# Patient Record
Sex: Female | Born: 1955 | Race: Black or African American | Hispanic: No | Smoking: Current every day smoker
Health system: Southern US, Community
[De-identification: ages and names within clinical notes are randomized; demographics above are authoritative.]

## PROBLEM LIST (undated history)

## (undated) DIAGNOSIS — E119 Type 2 diabetes mellitus without complications: Secondary | ICD-10-CM

## (undated) DIAGNOSIS — IMO0002 Reserved for concepts with insufficient information to code with codable children: Secondary | ICD-10-CM

## (undated) DIAGNOSIS — G43909 Migraine, unspecified, not intractable, without status migrainosus: Secondary | ICD-10-CM

## (undated) DIAGNOSIS — F329 Major depressive disorder, single episode, unspecified: Secondary | ICD-10-CM

## (undated) DIAGNOSIS — E079 Disorder of thyroid, unspecified: Secondary | ICD-10-CM

## (undated) DIAGNOSIS — G8929 Other chronic pain: Secondary | ICD-10-CM

## (undated) DIAGNOSIS — R011 Cardiac murmur, unspecified: Secondary | ICD-10-CM

## (undated) DIAGNOSIS — F419 Anxiety disorder, unspecified: Secondary | ICD-10-CM

## (undated) DIAGNOSIS — I10 Essential (primary) hypertension: Secondary | ICD-10-CM

## (undated) DIAGNOSIS — F32A Depression, unspecified: Secondary | ICD-10-CM

## (undated) DIAGNOSIS — E785 Hyperlipidemia, unspecified: Secondary | ICD-10-CM

## (undated) DIAGNOSIS — G4700 Insomnia, unspecified: Secondary | ICD-10-CM

## (undated) HISTORY — DX: Insomnia, unspecified: G47.00

## (undated) HISTORY — DX: Major depressive disorder, single episode, unspecified: F32.9

## (undated) HISTORY — DX: Disorder of thyroid, unspecified: E07.9

## (undated) HISTORY — DX: Hyperlipidemia, unspecified: E78.5

## (undated) HISTORY — PX: ABDOMINAL HYSTERECTOMY: SHX81

## (undated) HISTORY — PX: CARPAL TUNNEL RELEASE: SHX101

## (undated) HISTORY — DX: Reserved for concepts with insufficient information to code with codable children: IMO0002

## (undated) HISTORY — PX: WISDOM TOOTH EXTRACTION: SHX21

## (undated) HISTORY — PX: APPENDECTOMY: SHX54

## (undated) HISTORY — DX: Type 2 diabetes mellitus without complications: E11.9

## (undated) HISTORY — DX: Depression, unspecified: F32.A

## (undated) HISTORY — DX: Anxiety disorder, unspecified: F41.9

## (undated) HISTORY — DX: Cardiac murmur, unspecified: R01.1

## (undated) HISTORY — PX: UTERINE FIBROID SURGERY: SHX826

---

## 1988-04-07 HISTORY — PX: CYST EXCISION: SHX5701

## 1997-10-03 ENCOUNTER — Emergency Department (HOSPITAL_COMMUNITY): Admission: EM | Admit: 1997-10-03 | Discharge: 1997-10-03 | Payer: Self-pay | Admitting: Emergency Medicine

## 1997-10-26 ENCOUNTER — Emergency Department (HOSPITAL_COMMUNITY): Admission: EM | Admit: 1997-10-26 | Discharge: 1997-10-26 | Payer: Self-pay | Admitting: Emergency Medicine

## 1997-12-14 ENCOUNTER — Emergency Department (HOSPITAL_COMMUNITY): Admission: EM | Admit: 1997-12-14 | Discharge: 1997-12-14 | Payer: Self-pay

## 1998-01-03 ENCOUNTER — Encounter: Admission: RE | Admit: 1998-01-03 | Discharge: 1998-01-03 | Payer: Self-pay | Admitting: Internal Medicine

## 1998-04-03 ENCOUNTER — Inpatient Hospital Stay (HOSPITAL_COMMUNITY): Admission: AD | Admit: 1998-04-03 | Discharge: 1998-04-03 | Payer: Self-pay | Admitting: *Deleted

## 1998-04-10 ENCOUNTER — Encounter: Admission: RE | Admit: 1998-04-10 | Discharge: 1998-04-10 | Payer: Self-pay | Admitting: Internal Medicine

## 1998-04-17 ENCOUNTER — Emergency Department (HOSPITAL_COMMUNITY): Admission: EM | Admit: 1998-04-17 | Discharge: 1998-04-17 | Payer: Self-pay | Admitting: Emergency Medicine

## 1998-04-19 ENCOUNTER — Encounter: Payer: Self-pay | Admitting: Obstetrics

## 1998-04-19 ENCOUNTER — Ambulatory Visit (HOSPITAL_COMMUNITY): Admission: RE | Admit: 1998-04-19 | Discharge: 1998-04-19 | Payer: Self-pay | Admitting: Obstetrics

## 1998-05-03 ENCOUNTER — Emergency Department (HOSPITAL_COMMUNITY): Admission: EM | Admit: 1998-05-03 | Discharge: 1998-05-03 | Payer: Self-pay | Admitting: Emergency Medicine

## 1998-06-21 ENCOUNTER — Emergency Department (HOSPITAL_COMMUNITY): Admission: EM | Admit: 1998-06-21 | Discharge: 1998-06-21 | Payer: Self-pay | Admitting: Emergency Medicine

## 1998-06-23 ENCOUNTER — Emergency Department (HOSPITAL_COMMUNITY): Admission: EM | Admit: 1998-06-23 | Discharge: 1998-06-23 | Payer: Self-pay | Admitting: Emergency Medicine

## 1998-08-12 ENCOUNTER — Emergency Department (HOSPITAL_COMMUNITY): Admission: EM | Admit: 1998-08-12 | Discharge: 1998-08-12 | Payer: Self-pay | Admitting: Emergency Medicine

## 1998-10-17 ENCOUNTER — Emergency Department (HOSPITAL_COMMUNITY): Admission: EM | Admit: 1998-10-17 | Discharge: 1998-10-17 | Payer: Self-pay | Admitting: Emergency Medicine

## 1998-10-19 ENCOUNTER — Emergency Department (HOSPITAL_COMMUNITY): Admission: EM | Admit: 1998-10-19 | Discharge: 1998-10-19 | Payer: Self-pay | Admitting: Emergency Medicine

## 1999-01-08 ENCOUNTER — Emergency Department (HOSPITAL_COMMUNITY): Admission: EM | Admit: 1999-01-08 | Discharge: 1999-01-09 | Payer: Self-pay

## 1999-01-11 ENCOUNTER — Encounter: Admission: RE | Admit: 1999-01-11 | Discharge: 1999-01-11 | Payer: Self-pay | Admitting: Internal Medicine

## 1999-04-22 ENCOUNTER — Emergency Department (HOSPITAL_COMMUNITY): Admission: EM | Admit: 1999-04-22 | Discharge: 1999-04-22 | Payer: Self-pay | Admitting: *Deleted

## 1999-06-21 ENCOUNTER — Encounter: Admission: RE | Admit: 1999-06-21 | Discharge: 1999-06-21 | Payer: Self-pay | Admitting: Obstetrics & Gynecology

## 1999-07-05 ENCOUNTER — Emergency Department (HOSPITAL_COMMUNITY): Admission: EM | Admit: 1999-07-05 | Discharge: 1999-07-06 | Payer: Self-pay | Admitting: Emergency Medicine

## 1999-08-13 ENCOUNTER — Ambulatory Visit (HOSPITAL_COMMUNITY): Admission: RE | Admit: 1999-08-13 | Discharge: 1999-08-13 | Payer: Self-pay | Admitting: Orthopedic Surgery

## 1999-08-13 ENCOUNTER — Encounter: Payer: Self-pay | Admitting: Orthopedic Surgery

## 1999-12-29 ENCOUNTER — Emergency Department (HOSPITAL_COMMUNITY): Admission: EM | Admit: 1999-12-29 | Discharge: 1999-12-29 | Payer: Self-pay | Admitting: Emergency Medicine

## 2000-01-03 ENCOUNTER — Encounter: Admission: RE | Admit: 2000-01-03 | Discharge: 2000-01-03 | Payer: Self-pay | Admitting: Internal Medicine

## 2000-03-10 ENCOUNTER — Encounter: Admission: RE | Admit: 2000-03-10 | Discharge: 2000-03-10 | Payer: Self-pay | Admitting: Internal Medicine

## 2000-03-20 ENCOUNTER — Encounter: Admission: RE | Admit: 2000-03-20 | Discharge: 2000-03-20 | Payer: Self-pay | Admitting: Obstetrics & Gynecology

## 2000-04-25 ENCOUNTER — Inpatient Hospital Stay (HOSPITAL_COMMUNITY): Admission: AD | Admit: 2000-04-25 | Discharge: 2000-04-25 | Payer: Self-pay | Admitting: *Deleted

## 2000-04-26 ENCOUNTER — Inpatient Hospital Stay (HOSPITAL_COMMUNITY): Admission: AD | Admit: 2000-04-26 | Discharge: 2000-04-26 | Payer: Self-pay | Admitting: *Deleted

## 2000-05-28 ENCOUNTER — Inpatient Hospital Stay (HOSPITAL_COMMUNITY): Admission: AD | Admit: 2000-05-28 | Discharge: 2000-05-28 | Payer: Self-pay | Admitting: Obstetrics & Gynecology

## 2000-06-30 ENCOUNTER — Encounter: Admission: RE | Admit: 2000-06-30 | Discharge: 2000-06-30 | Payer: Self-pay | Admitting: Obstetrics & Gynecology

## 2000-07-28 ENCOUNTER — Encounter: Admission: RE | Admit: 2000-07-28 | Discharge: 2000-07-28 | Payer: Self-pay | Admitting: Internal Medicine

## 2000-08-04 ENCOUNTER — Encounter: Admission: RE | Admit: 2000-08-04 | Discharge: 2000-08-04 | Payer: Self-pay | Admitting: Psychology

## 2000-08-25 ENCOUNTER — Encounter: Admission: RE | Admit: 2000-08-25 | Discharge: 2000-08-25 | Payer: Self-pay | Admitting: Obstetrics & Gynecology

## 2000-09-21 ENCOUNTER — Encounter (INDEPENDENT_AMBULATORY_CARE_PROVIDER_SITE_OTHER): Payer: Self-pay | Admitting: Specialist

## 2000-09-21 ENCOUNTER — Ambulatory Visit (HOSPITAL_COMMUNITY): Admission: RE | Admit: 2000-09-21 | Discharge: 2000-09-21 | Payer: Self-pay | Admitting: Obstetrics & Gynecology

## 2000-10-06 ENCOUNTER — Encounter: Admission: RE | Admit: 2000-10-06 | Discharge: 2000-10-06 | Payer: Self-pay | Admitting: Obstetrics & Gynecology

## 2000-10-20 ENCOUNTER — Ambulatory Visit (HOSPITAL_COMMUNITY): Admission: RE | Admit: 2000-10-20 | Discharge: 2000-10-20 | Payer: Self-pay | Admitting: Obstetrics & Gynecology

## 2000-12-18 ENCOUNTER — Encounter: Admission: RE | Admit: 2000-12-18 | Discharge: 2000-12-18 | Payer: Self-pay | Admitting: Internal Medicine

## 2001-01-19 ENCOUNTER — Encounter: Admission: RE | Admit: 2001-01-19 | Discharge: 2001-01-19 | Payer: Self-pay | Admitting: Obstetrics & Gynecology

## 2001-02-08 ENCOUNTER — Encounter: Admission: RE | Admit: 2001-02-08 | Discharge: 2001-02-08 | Payer: Self-pay | Admitting: Internal Medicine

## 2001-02-16 ENCOUNTER — Encounter: Admission: RE | Admit: 2001-02-16 | Discharge: 2001-02-16 | Payer: Self-pay | Admitting: Internal Medicine

## 2001-03-22 ENCOUNTER — Encounter (INDEPENDENT_AMBULATORY_CARE_PROVIDER_SITE_OTHER): Payer: Self-pay | Admitting: Specialist

## 2001-03-22 ENCOUNTER — Inpatient Hospital Stay (HOSPITAL_COMMUNITY): Admission: RE | Admit: 2001-03-22 | Discharge: 2001-03-26 | Payer: Self-pay | Admitting: *Deleted

## 2001-03-29 ENCOUNTER — Encounter: Payer: Self-pay | Admitting: Obstetrics

## 2001-03-29 ENCOUNTER — Inpatient Hospital Stay (HOSPITAL_COMMUNITY): Admission: AD | Admit: 2001-03-29 | Discharge: 2001-04-03 | Payer: Self-pay | Admitting: Obstetrics

## 2001-03-30 ENCOUNTER — Encounter: Payer: Self-pay | Admitting: Obstetrics

## 2001-03-31 ENCOUNTER — Encounter: Payer: Self-pay | Admitting: Obstetrics

## 2001-04-01 ENCOUNTER — Encounter: Payer: Self-pay | Admitting: Obstetrics

## 2001-04-04 ENCOUNTER — Observation Stay (HOSPITAL_COMMUNITY): Admission: AD | Admit: 2001-04-04 | Discharge: 2001-04-05 | Payer: Self-pay | Admitting: Obstetrics

## 2001-04-07 DIAGNOSIS — F332 Major depressive disorder, recurrent severe without psychotic features: Secondary | ICD-10-CM | POA: Insufficient documentation

## 2001-05-30 ENCOUNTER — Emergency Department (HOSPITAL_COMMUNITY): Admission: EM | Admit: 2001-05-30 | Discharge: 2001-05-30 | Payer: Self-pay | Admitting: Emergency Medicine

## 2001-06-28 ENCOUNTER — Encounter: Admission: RE | Admit: 2001-06-28 | Discharge: 2001-06-28 | Payer: Self-pay | Admitting: Internal Medicine

## 2001-12-21 ENCOUNTER — Encounter: Admission: RE | Admit: 2001-12-21 | Discharge: 2001-12-21 | Payer: Self-pay | Admitting: Internal Medicine

## 2001-12-28 ENCOUNTER — Emergency Department (HOSPITAL_COMMUNITY): Admission: EM | Admit: 2001-12-28 | Discharge: 2001-12-28 | Payer: Self-pay | Admitting: Emergency Medicine

## 2002-06-26 ENCOUNTER — Emergency Department (HOSPITAL_COMMUNITY): Admission: EM | Admit: 2002-06-26 | Discharge: 2002-06-26 | Payer: Self-pay | Admitting: Emergency Medicine

## 2002-07-07 ENCOUNTER — Encounter: Admission: RE | Admit: 2002-07-07 | Discharge: 2002-08-30 | Payer: Self-pay | Admitting: Orthopedic Surgery

## 2002-08-15 ENCOUNTER — Ambulatory Visit (HOSPITAL_COMMUNITY): Admission: RE | Admit: 2002-08-15 | Discharge: 2002-08-15 | Payer: Self-pay | Admitting: Obstetrics & Gynecology

## 2002-08-15 ENCOUNTER — Encounter: Payer: Self-pay | Admitting: Obstetrics & Gynecology

## 2002-08-22 ENCOUNTER — Encounter: Admission: RE | Admit: 2002-08-22 | Discharge: 2002-08-22 | Payer: Self-pay | Admitting: Internal Medicine

## 2002-09-30 ENCOUNTER — Emergency Department (HOSPITAL_COMMUNITY): Admission: AD | Admit: 2002-09-30 | Discharge: 2002-09-30 | Payer: Self-pay | Admitting: Emergency Medicine

## 2002-10-09 ENCOUNTER — Emergency Department (HOSPITAL_COMMUNITY): Admission: AD | Admit: 2002-10-09 | Discharge: 2002-10-09 | Payer: Self-pay | Admitting: *Deleted

## 2002-10-12 ENCOUNTER — Encounter: Payer: Self-pay | Admitting: Orthopedic Surgery

## 2002-10-12 ENCOUNTER — Encounter: Admission: RE | Admit: 2002-10-12 | Discharge: 2002-10-12 | Payer: Self-pay | Admitting: Orthopedic Surgery

## 2002-10-12 ENCOUNTER — Encounter: Payer: Self-pay | Admitting: Diagnostic Radiology

## 2002-10-22 ENCOUNTER — Emergency Department (HOSPITAL_COMMUNITY): Admission: EM | Admit: 2002-10-22 | Discharge: 2002-10-23 | Payer: Self-pay | Admitting: Emergency Medicine

## 2002-11-26 ENCOUNTER — Emergency Department (HOSPITAL_COMMUNITY): Admission: EM | Admit: 2002-11-26 | Discharge: 2002-11-26 | Payer: Self-pay | Admitting: Emergency Medicine

## 2002-12-17 ENCOUNTER — Emergency Department (HOSPITAL_COMMUNITY): Admission: EM | Admit: 2002-12-17 | Discharge: 2002-12-18 | Payer: Self-pay | Admitting: Emergency Medicine

## 2003-01-09 ENCOUNTER — Emergency Department (HOSPITAL_COMMUNITY): Admission: EM | Admit: 2003-01-09 | Discharge: 2003-01-10 | Payer: Self-pay | Admitting: Emergency Medicine

## 2003-02-01 ENCOUNTER — Emergency Department (HOSPITAL_COMMUNITY): Admission: EM | Admit: 2003-02-01 | Discharge: 2003-02-01 | Payer: Self-pay | Admitting: Emergency Medicine

## 2003-02-03 ENCOUNTER — Emergency Department (HOSPITAL_COMMUNITY): Admission: EM | Admit: 2003-02-03 | Discharge: 2003-02-03 | Payer: Self-pay | Admitting: Emergency Medicine

## 2003-03-15 ENCOUNTER — Emergency Department (HOSPITAL_COMMUNITY): Admission: EM | Admit: 2003-03-15 | Discharge: 2003-03-15 | Payer: Self-pay | Admitting: Emergency Medicine

## 2003-05-02 ENCOUNTER — Emergency Department (HOSPITAL_COMMUNITY): Admission: EM | Admit: 2003-05-02 | Discharge: 2003-05-02 | Payer: Self-pay | Admitting: *Deleted

## 2003-11-03 ENCOUNTER — Emergency Department (HOSPITAL_COMMUNITY): Admission: EM | Admit: 2003-11-03 | Discharge: 2003-11-03 | Payer: Self-pay

## 2003-12-10 ENCOUNTER — Emergency Department (HOSPITAL_COMMUNITY): Admission: EM | Admit: 2003-12-10 | Discharge: 2003-12-11 | Payer: Self-pay | Admitting: Emergency Medicine

## 2004-04-16 ENCOUNTER — Encounter
Admission: RE | Admit: 2004-04-16 | Discharge: 2004-07-15 | Payer: Self-pay | Admitting: Physical Medicine and Rehabilitation

## 2004-07-13 ENCOUNTER — Emergency Department (HOSPITAL_COMMUNITY): Admission: EM | Admit: 2004-07-13 | Discharge: 2004-07-13 | Payer: Self-pay | Admitting: Emergency Medicine

## 2004-08-15 ENCOUNTER — Encounter
Admission: RE | Admit: 2004-08-15 | Discharge: 2004-11-13 | Payer: Self-pay | Admitting: Physical Medicine & Rehabilitation

## 2004-09-10 ENCOUNTER — Emergency Department (HOSPITAL_COMMUNITY): Admission: EM | Admit: 2004-09-10 | Discharge: 2004-09-10 | Payer: Self-pay | Admitting: Emergency Medicine

## 2004-09-30 ENCOUNTER — Ambulatory Visit: Payer: Self-pay | Admitting: Physical Medicine & Rehabilitation

## 2004-11-08 ENCOUNTER — Ambulatory Visit: Payer: Self-pay | Admitting: Physical Medicine & Rehabilitation

## 2004-12-19 ENCOUNTER — Encounter
Admission: RE | Admit: 2004-12-19 | Discharge: 2005-03-19 | Payer: Self-pay | Admitting: Physical Medicine & Rehabilitation

## 2005-02-01 ENCOUNTER — Emergency Department (HOSPITAL_COMMUNITY): Admission: EM | Admit: 2005-02-01 | Discharge: 2005-02-01 | Payer: Self-pay | Admitting: Emergency Medicine

## 2005-02-07 ENCOUNTER — Ambulatory Visit: Payer: Self-pay | Admitting: Physical Medicine & Rehabilitation

## 2005-03-07 ENCOUNTER — Emergency Department (HOSPITAL_COMMUNITY): Admission: EM | Admit: 2005-03-07 | Discharge: 2005-03-07 | Payer: Self-pay | Admitting: Emergency Medicine

## 2005-04-25 ENCOUNTER — Emergency Department (HOSPITAL_COMMUNITY): Admission: EM | Admit: 2005-04-25 | Discharge: 2005-04-25 | Payer: Self-pay | Admitting: Emergency Medicine

## 2005-06-10 ENCOUNTER — Encounter
Admission: RE | Admit: 2005-06-10 | Discharge: 2005-09-08 | Payer: Self-pay | Admitting: Physical Medicine & Rehabilitation

## 2005-06-10 ENCOUNTER — Ambulatory Visit: Payer: Self-pay | Admitting: Physical Medicine & Rehabilitation

## 2005-09-15 ENCOUNTER — Emergency Department (HOSPITAL_COMMUNITY): Admission: EM | Admit: 2005-09-15 | Discharge: 2005-09-15 | Payer: Self-pay | Admitting: Emergency Medicine

## 2005-10-21 ENCOUNTER — Encounter
Admission: RE | Admit: 2005-10-21 | Discharge: 2006-01-19 | Payer: Self-pay | Admitting: Physical Medicine & Rehabilitation

## 2005-10-24 ENCOUNTER — Ambulatory Visit: Payer: Self-pay | Admitting: Physical Medicine & Rehabilitation

## 2005-11-28 ENCOUNTER — Ambulatory Visit: Payer: Self-pay | Admitting: Physical Medicine & Rehabilitation

## 2006-01-13 ENCOUNTER — Ambulatory Visit: Payer: Self-pay | Admitting: Family Medicine

## 2006-02-05 ENCOUNTER — Ambulatory Visit: Payer: Self-pay | Admitting: Internal Medicine

## 2006-03-06 ENCOUNTER — Ambulatory Visit: Payer: Self-pay | Admitting: Internal Medicine

## 2006-04-03 ENCOUNTER — Ambulatory Visit: Payer: Self-pay | Admitting: Internal Medicine

## 2006-04-03 LAB — CONVERTED CEMR LAB
Cholesterol: 166 mg/dL
HDL: 35 mg/dL
LDL Cholesterol: 89 mg/dL
Triglycerides: 208 mg/dL

## 2006-04-09 ENCOUNTER — Ambulatory Visit: Payer: Self-pay | Admitting: *Deleted

## 2006-04-24 ENCOUNTER — Emergency Department (HOSPITAL_COMMUNITY): Admission: EM | Admit: 2006-04-24 | Discharge: 2006-04-24 | Payer: Self-pay | Admitting: Emergency Medicine

## 2006-04-25 ENCOUNTER — Emergency Department (HOSPITAL_COMMUNITY): Admission: EM | Admit: 2006-04-25 | Discharge: 2006-04-25 | Payer: Self-pay | Admitting: Emergency Medicine

## 2006-08-06 ENCOUNTER — Ambulatory Visit: Payer: Self-pay | Admitting: Internal Medicine

## 2006-08-20 ENCOUNTER — Ambulatory Visit (HOSPITAL_COMMUNITY): Admission: RE | Admit: 2006-08-20 | Discharge: 2006-08-20 | Payer: Self-pay | Admitting: Internal Medicine

## 2006-08-20 ENCOUNTER — Encounter (INDEPENDENT_AMBULATORY_CARE_PROVIDER_SITE_OTHER): Payer: Self-pay | Admitting: Internal Medicine

## 2006-08-20 LAB — CONVERTED CEMR LAB: Pap Smear: NORMAL

## 2006-11-16 ENCOUNTER — Encounter: Payer: Self-pay | Admitting: Internal Medicine

## 2006-11-16 DIAGNOSIS — I10 Essential (primary) hypertension: Secondary | ICD-10-CM | POA: Insufficient documentation

## 2006-11-16 DIAGNOSIS — G43909 Migraine, unspecified, not intractable, without status migrainosus: Secondary | ICD-10-CM | POA: Insufficient documentation

## 2006-11-16 DIAGNOSIS — G5600 Carpal tunnel syndrome, unspecified upper limb: Secondary | ICD-10-CM

## 2006-11-16 DIAGNOSIS — E785 Hyperlipidemia, unspecified: Secondary | ICD-10-CM

## 2006-11-16 DIAGNOSIS — M5137 Other intervertebral disc degeneration, lumbosacral region: Secondary | ICD-10-CM

## 2006-12-04 ENCOUNTER — Telehealth (INDEPENDENT_AMBULATORY_CARE_PROVIDER_SITE_OTHER): Payer: Self-pay | Admitting: Internal Medicine

## 2006-12-22 ENCOUNTER — Telehealth (INDEPENDENT_AMBULATORY_CARE_PROVIDER_SITE_OTHER): Payer: Self-pay | Admitting: *Deleted

## 2006-12-23 ENCOUNTER — Encounter (INDEPENDENT_AMBULATORY_CARE_PROVIDER_SITE_OTHER): Payer: Self-pay | Admitting: *Deleted

## 2006-12-28 ENCOUNTER — Encounter (INDEPENDENT_AMBULATORY_CARE_PROVIDER_SITE_OTHER): Payer: Self-pay | Admitting: Internal Medicine

## 2007-01-14 ENCOUNTER — Ambulatory Visit: Payer: Self-pay | Admitting: Internal Medicine

## 2007-01-15 ENCOUNTER — Encounter (INDEPENDENT_AMBULATORY_CARE_PROVIDER_SITE_OTHER): Payer: Self-pay | Admitting: Internal Medicine

## 2007-02-14 ENCOUNTER — Telehealth (INDEPENDENT_AMBULATORY_CARE_PROVIDER_SITE_OTHER): Payer: Self-pay | Admitting: Internal Medicine

## 2007-04-09 ENCOUNTER — Telehealth (INDEPENDENT_AMBULATORY_CARE_PROVIDER_SITE_OTHER): Payer: Self-pay | Admitting: *Deleted

## 2007-06-02 ENCOUNTER — Emergency Department (HOSPITAL_COMMUNITY): Admission: EM | Admit: 2007-06-02 | Discharge: 2007-06-03 | Payer: Self-pay | Admitting: Emergency Medicine

## 2007-07-01 ENCOUNTER — Emergency Department (HOSPITAL_COMMUNITY): Admission: EM | Admit: 2007-07-01 | Discharge: 2007-07-01 | Payer: Self-pay | Admitting: Emergency Medicine

## 2007-10-06 ENCOUNTER — Telehealth (INDEPENDENT_AMBULATORY_CARE_PROVIDER_SITE_OTHER): Payer: Self-pay | Admitting: Internal Medicine

## 2007-11-18 ENCOUNTER — Encounter (INDEPENDENT_AMBULATORY_CARE_PROVIDER_SITE_OTHER): Payer: Self-pay | Admitting: *Deleted

## 2007-12-09 ENCOUNTER — Ambulatory Visit: Payer: Self-pay | Admitting: Internal Medicine

## 2007-12-15 LAB — CONVERTED CEMR LAB
ALT: 10 units/L (ref 0–35)
AST: 18 units/L (ref 0–37)
Albumin: 4.7 g/dL (ref 3.5–5.2)
Alkaline Phosphatase: 49 units/L (ref 39–117)
BUN: 12 mg/dL (ref 6–23)
Basophils Absolute: 0 10*3/uL (ref 0.0–0.1)
Basophils Relative: 0 % (ref 0–1)
CO2: 24 meq/L (ref 19–32)
Calcium: 9.4 mg/dL (ref 8.4–10.5)
Chloride: 107 meq/L (ref 96–112)
Creatinine, Ser: 1.01 mg/dL (ref 0.40–1.20)
Eosinophils Absolute: 0.1 10*3/uL (ref 0.0–0.7)
Eosinophils Relative: 2 % (ref 0–5)
Glucose, Bld: 78 mg/dL (ref 70–99)
HCT: 35.5 % — ABNORMAL LOW (ref 36.0–46.0)
Hemoglobin: 12 g/dL (ref 12.0–15.0)
Lymphocytes Relative: 45 % (ref 12–46)
Lymphs Abs: 2.6 10*3/uL (ref 0.7–4.0)
MCHC: 33.8 g/dL (ref 30.0–36.0)
MCV: 95.9 fL (ref 78.0–100.0)
Monocytes Absolute: 0.3 10*3/uL (ref 0.1–1.0)
Monocytes Relative: 5 % (ref 3–12)
Neutro Abs: 2.7 10*3/uL (ref 1.7–7.7)
Neutrophils Relative %: 47 % (ref 43–77)
Platelets: 220 10*3/uL (ref 150–400)
Potassium: 5.1 meq/L (ref 3.5–5.3)
RBC: 3.7 M/uL — ABNORMAL LOW (ref 3.87–5.11)
RDW: 12.9 % (ref 11.5–15.5)
Sodium: 140 meq/L (ref 135–145)
Total Bilirubin: 0.3 mg/dL (ref 0.3–1.2)
Total Protein: 7.9 g/dL (ref 6.0–8.3)
WBC: 5.8 10*3/uL (ref 4.0–10.5)

## 2007-12-17 ENCOUNTER — Encounter (INDEPENDENT_AMBULATORY_CARE_PROVIDER_SITE_OTHER): Payer: Self-pay | Admitting: Internal Medicine

## 2007-12-20 ENCOUNTER — Encounter: Admission: RE | Admit: 2007-12-20 | Discharge: 2008-02-02 | Payer: Self-pay | Admitting: Internal Medicine

## 2007-12-24 ENCOUNTER — Encounter (INDEPENDENT_AMBULATORY_CARE_PROVIDER_SITE_OTHER): Payer: Self-pay | Admitting: Internal Medicine

## 2008-01-07 ENCOUNTER — Telehealth (INDEPENDENT_AMBULATORY_CARE_PROVIDER_SITE_OTHER): Payer: Self-pay | Admitting: Internal Medicine

## 2008-02-03 ENCOUNTER — Encounter (INDEPENDENT_AMBULATORY_CARE_PROVIDER_SITE_OTHER): Payer: Self-pay | Admitting: Internal Medicine

## 2008-02-04 ENCOUNTER — Emergency Department (HOSPITAL_COMMUNITY): Admission: EM | Admit: 2008-02-04 | Discharge: 2008-02-05 | Payer: Self-pay | Admitting: *Deleted

## 2008-02-17 ENCOUNTER — Telehealth (INDEPENDENT_AMBULATORY_CARE_PROVIDER_SITE_OTHER): Payer: Self-pay | Admitting: *Deleted

## 2008-02-25 ENCOUNTER — Telehealth (INDEPENDENT_AMBULATORY_CARE_PROVIDER_SITE_OTHER): Payer: Self-pay | Admitting: Internal Medicine

## 2008-02-25 DIAGNOSIS — K029 Dental caries, unspecified: Secondary | ICD-10-CM | POA: Insufficient documentation

## 2008-02-28 ENCOUNTER — Encounter (INDEPENDENT_AMBULATORY_CARE_PROVIDER_SITE_OTHER): Payer: Self-pay | Admitting: Internal Medicine

## 2008-03-10 ENCOUNTER — Telehealth (INDEPENDENT_AMBULATORY_CARE_PROVIDER_SITE_OTHER): Payer: Self-pay | Admitting: Internal Medicine

## 2008-03-22 ENCOUNTER — Ambulatory Visit (HOSPITAL_COMMUNITY): Admission: RE | Admit: 2008-03-22 | Discharge: 2008-03-22 | Payer: Self-pay | Admitting: Internal Medicine

## 2008-04-07 ENCOUNTER — Emergency Department (HOSPITAL_COMMUNITY): Admission: EM | Admit: 2008-04-07 | Discharge: 2008-04-07 | Payer: Self-pay | Admitting: Emergency Medicine

## 2008-04-20 LAB — CONVERTED CEMR LAB: Pap Smear: NORMAL

## 2008-05-08 ENCOUNTER — Emergency Department (HOSPITAL_COMMUNITY): Admission: EM | Admit: 2008-05-08 | Discharge: 2008-05-08 | Payer: Self-pay | Admitting: Emergency Medicine

## 2008-05-12 ENCOUNTER — Telehealth (INDEPENDENT_AMBULATORY_CARE_PROVIDER_SITE_OTHER): Payer: Self-pay | Admitting: Internal Medicine

## 2008-06-08 ENCOUNTER — Emergency Department (HOSPITAL_COMMUNITY): Admission: EM | Admit: 2008-06-08 | Discharge: 2008-06-08 | Payer: Self-pay | Admitting: Emergency Medicine

## 2008-06-19 ENCOUNTER — Encounter (INDEPENDENT_AMBULATORY_CARE_PROVIDER_SITE_OTHER): Payer: Self-pay | Admitting: Internal Medicine

## 2008-07-06 ENCOUNTER — Telehealth (INDEPENDENT_AMBULATORY_CARE_PROVIDER_SITE_OTHER): Payer: Self-pay | Admitting: Internal Medicine

## 2008-07-14 ENCOUNTER — Ambulatory Visit: Payer: Self-pay | Admitting: Internal Medicine

## 2008-07-31 LAB — CONVERTED CEMR LAB
ALT: 46 units/L — ABNORMAL HIGH (ref 0–35)
AST: 26 units/L (ref 0–37)
Albumin: 4.8 g/dL (ref 3.5–5.2)
Alkaline Phosphatase: 78 units/L (ref 39–117)
BUN: 11 mg/dL (ref 6–23)
Basophils Absolute: 0 10*3/uL (ref 0.0–0.1)
Basophils Relative: 0 % (ref 0–1)
CO2: 26 meq/L (ref 19–32)
Calcium: 10.3 mg/dL (ref 8.4–10.5)
Chloride: 104 meq/L (ref 96–112)
Cholesterol: 157 mg/dL (ref 0–200)
Creatinine, Ser: 0.88 mg/dL (ref 0.40–1.20)
Eosinophils Absolute: 0.1 10*3/uL (ref 0.0–0.7)
Eosinophils Relative: 1 % (ref 0–5)
Glucose, Bld: 86 mg/dL (ref 70–99)
HCT: 38 % (ref 36.0–46.0)
HDL: 37 mg/dL — ABNORMAL LOW (ref >39)
Hemoglobin: 12.9 g/dL (ref 12.0–15.0)
LDL Cholesterol: 81 mg/dL (ref 0–99)
Lymphocytes Relative: 40 % (ref 12–46)
Lymphs Abs: 2 10*3/uL (ref 0.7–4.0)
MCHC: 33.9 g/dL (ref 30.0–36.0)
MCV: 93.8 fL (ref 78.0–100.0)
Monocytes Absolute: 0.4 10*3/uL (ref 0.1–1.0)
Monocytes Relative: 7 % (ref 3–12)
Neutro Abs: 2.7 10*3/uL (ref 1.7–7.7)
Neutrophils Relative %: 52 % (ref 43–77)
Platelets: 234 10*3/uL (ref 150–400)
Potassium: 5.1 meq/L (ref 3.5–5.3)
RBC: 4.05 M/uL (ref 3.87–5.11)
RDW: 12.8 % (ref 11.5–15.5)
Sodium: 140 meq/L (ref 135–145)
Total Bilirubin: 0.3 mg/dL (ref 0.3–1.2)
Total CHOL/HDL Ratio: 4.2
Total Protein: 8.1 g/dL (ref 6.0–8.3)
Triglycerides: 196 mg/dL — ABNORMAL HIGH (ref <150)
VLDL: 39 mg/dL (ref 0–40)
WBC: 5.2 10*3/uL (ref 4.0–10.5)

## 2008-08-09 ENCOUNTER — Encounter (INDEPENDENT_AMBULATORY_CARE_PROVIDER_SITE_OTHER): Payer: Self-pay | Admitting: Internal Medicine

## 2008-09-30 ENCOUNTER — Emergency Department (HOSPITAL_COMMUNITY): Admission: EM | Admit: 2008-09-30 | Discharge: 2008-09-30 | Payer: Self-pay | Admitting: Emergency Medicine

## 2008-10-06 ENCOUNTER — Telehealth (INDEPENDENT_AMBULATORY_CARE_PROVIDER_SITE_OTHER): Payer: Self-pay | Admitting: Internal Medicine

## 2008-11-28 ENCOUNTER — Emergency Department (HOSPITAL_COMMUNITY): Admission: EM | Admit: 2008-11-28 | Discharge: 2008-11-28 | Payer: Self-pay | Admitting: Emergency Medicine

## 2009-01-05 ENCOUNTER — Telehealth (INDEPENDENT_AMBULATORY_CARE_PROVIDER_SITE_OTHER): Payer: Self-pay | Admitting: Internal Medicine

## 2009-02-02 ENCOUNTER — Encounter: Payer: Self-pay | Admitting: Internal Medicine

## 2009-02-24 ENCOUNTER — Emergency Department (HOSPITAL_COMMUNITY): Admission: EM | Admit: 2009-02-24 | Discharge: 2009-02-24 | Payer: Self-pay | Admitting: Emergency Medicine

## 2009-03-06 ENCOUNTER — Telehealth (INDEPENDENT_AMBULATORY_CARE_PROVIDER_SITE_OTHER): Payer: Self-pay | Admitting: *Deleted

## 2009-03-08 ENCOUNTER — Telehealth (INDEPENDENT_AMBULATORY_CARE_PROVIDER_SITE_OTHER): Payer: Self-pay | Admitting: Internal Medicine

## 2009-05-08 ENCOUNTER — Encounter (INDEPENDENT_AMBULATORY_CARE_PROVIDER_SITE_OTHER): Payer: Self-pay | Admitting: Internal Medicine

## 2009-06-05 ENCOUNTER — Telehealth (INDEPENDENT_AMBULATORY_CARE_PROVIDER_SITE_OTHER): Payer: Self-pay | Admitting: *Deleted

## 2009-06-30 ENCOUNTER — Emergency Department (HOSPITAL_COMMUNITY): Admission: EM | Admit: 2009-06-30 | Discharge: 2009-07-01 | Payer: Self-pay | Admitting: Emergency Medicine

## 2009-07-04 ENCOUNTER — Ambulatory Visit (HOSPITAL_COMMUNITY): Admission: RE | Admit: 2009-07-04 | Discharge: 2009-07-04 | Payer: Self-pay | Admitting: Internal Medicine

## 2009-07-17 ENCOUNTER — Ambulatory Visit: Payer: Self-pay | Admitting: Internal Medicine

## 2009-07-17 DIAGNOSIS — R300 Dysuria: Secondary | ICD-10-CM | POA: Insufficient documentation

## 2009-07-17 DIAGNOSIS — N760 Acute vaginitis: Secondary | ICD-10-CM | POA: Insufficient documentation

## 2009-07-17 LAB — CONVERTED CEMR LAB
Bilirubin Urine: NEGATIVE
Glucose, Urine, Semiquant: NEGATIVE
Ketones, urine, test strip: NEGATIVE
Nitrite: NEGATIVE
Protein, U semiquant: 30
Specific Gravity, Urine: >=1.03
Urobilinogen, UA: 0.2
pH: 5

## 2009-07-20 ENCOUNTER — Telehealth (INDEPENDENT_AMBULATORY_CARE_PROVIDER_SITE_OTHER): Payer: Self-pay | Admitting: Internal Medicine

## 2009-07-31 ENCOUNTER — Telehealth (INDEPENDENT_AMBULATORY_CARE_PROVIDER_SITE_OTHER): Payer: Self-pay | Admitting: Internal Medicine

## 2009-08-03 ENCOUNTER — Telehealth (INDEPENDENT_AMBULATORY_CARE_PROVIDER_SITE_OTHER): Payer: Self-pay | Admitting: Internal Medicine

## 2009-08-07 ENCOUNTER — Telehealth (INDEPENDENT_AMBULATORY_CARE_PROVIDER_SITE_OTHER): Payer: Self-pay | Admitting: Internal Medicine

## 2009-08-14 ENCOUNTER — Ambulatory Visit: Payer: Self-pay | Admitting: Internal Medicine

## 2009-08-14 LAB — CONVERTED CEMR LAB: Rapid HIV Screen: NEGATIVE

## 2009-08-31 ENCOUNTER — Telehealth (INDEPENDENT_AMBULATORY_CARE_PROVIDER_SITE_OTHER): Payer: Self-pay | Admitting: Internal Medicine

## 2009-09-02 ENCOUNTER — Encounter (INDEPENDENT_AMBULATORY_CARE_PROVIDER_SITE_OTHER): Payer: Self-pay | Admitting: Internal Medicine

## 2009-09-02 LAB — CONVERTED CEMR LAB
ALT: 28 units/L (ref 0–35)
AST: 36 units/L (ref 0–37)
Albumin: 4.6 g/dL (ref 3.5–5.2)
Alkaline Phosphatase: 64 units/L (ref 39–117)
BUN: 10 mg/dL (ref 6–23)
Basophils Absolute: 0 10*3/uL (ref 0.0–0.1)
Basophils Relative: 0 % (ref 0–1)
CO2: 24 meq/L (ref 19–32)
Calcium: 9.7 mg/dL (ref 8.4–10.5)
Chloride: 102 meq/L (ref 96–112)
Cholesterol: 201 mg/dL — ABNORMAL HIGH (ref 0–200)
Creatinine, Ser: 0.92 mg/dL (ref 0.40–1.20)
Eosinophils Absolute: 0.1 10*3/uL (ref 0.0–0.7)
Eosinophils Relative: 2 % (ref 0–5)
Glucose, Bld: 79 mg/dL (ref 70–99)
HCT: 36.4 % (ref 36.0–46.0)
HDL: 39 mg/dL — ABNORMAL LOW (ref >39)
Hemoglobin: 12.2 g/dL (ref 12.0–15.0)
LDL Cholesterol: 122 mg/dL — ABNORMAL HIGH (ref 0–99)
Lymphocytes Relative: 40 % (ref 12–46)
Lymphs Abs: 2 10*3/uL (ref 0.7–4.0)
MCHC: 33.5 g/dL (ref 30.0–36.0)
MCV: 94.1 fL (ref 78.0–100.0)
Monocytes Absolute: 0.3 10*3/uL (ref 0.1–1.0)
Monocytes Relative: 7 % (ref 3–12)
Neutro Abs: 2.6 10*3/uL (ref 1.7–7.7)
Neutrophils Relative %: 52 % (ref 43–77)
Platelets: 224 10*3/uL (ref 150–400)
Potassium: 4.1 meq/L (ref 3.5–5.3)
RBC: 3.87 M/uL (ref 3.87–5.11)
RDW: 13.8 % (ref 11.5–15.5)
Sodium: 140 meq/L (ref 135–145)
Total Bilirubin: 0.3 mg/dL (ref 0.3–1.2)
Total CHOL/HDL Ratio: 5.2
Total Protein: 8.3 g/dL (ref 6.0–8.3)
Triglycerides: 199 mg/dL — ABNORMAL HIGH (ref <150)
VLDL: 40 mg/dL (ref 0–40)
WBC: 5.1 10*3/uL (ref 4.0–10.5)

## 2009-09-04 ENCOUNTER — Ambulatory Visit: Payer: Self-pay | Admitting: Internal Medicine

## 2009-09-05 ENCOUNTER — Encounter (INDEPENDENT_AMBULATORY_CARE_PROVIDER_SITE_OTHER): Payer: Self-pay | Admitting: Internal Medicine

## 2009-09-05 ENCOUNTER — Telehealth (INDEPENDENT_AMBULATORY_CARE_PROVIDER_SITE_OTHER): Payer: Self-pay | Admitting: Internal Medicine

## 2009-09-11 ENCOUNTER — Encounter (INDEPENDENT_AMBULATORY_CARE_PROVIDER_SITE_OTHER): Payer: Self-pay | Admitting: Internal Medicine

## 2009-09-11 ENCOUNTER — Encounter: Admission: RE | Admit: 2009-09-11 | Discharge: 2009-09-11 | Payer: Self-pay | Admitting: Internal Medicine

## 2009-10-02 ENCOUNTER — Telehealth (INDEPENDENT_AMBULATORY_CARE_PROVIDER_SITE_OTHER): Payer: Self-pay | Admitting: Internal Medicine

## 2009-11-01 ENCOUNTER — Telehealth (INDEPENDENT_AMBULATORY_CARE_PROVIDER_SITE_OTHER): Payer: Self-pay | Admitting: Internal Medicine

## 2009-11-01 ENCOUNTER — Encounter (INDEPENDENT_AMBULATORY_CARE_PROVIDER_SITE_OTHER): Payer: Self-pay | Admitting: Internal Medicine

## 2009-11-22 ENCOUNTER — Telehealth (INDEPENDENT_AMBULATORY_CARE_PROVIDER_SITE_OTHER): Payer: Self-pay | Admitting: Nurse Practitioner

## 2009-11-23 ENCOUNTER — Telehealth (INDEPENDENT_AMBULATORY_CARE_PROVIDER_SITE_OTHER): Payer: Self-pay | Admitting: Internal Medicine

## 2009-11-23 ENCOUNTER — Encounter (INDEPENDENT_AMBULATORY_CARE_PROVIDER_SITE_OTHER): Payer: Self-pay | Admitting: Internal Medicine

## 2009-12-03 ENCOUNTER — Telehealth (INDEPENDENT_AMBULATORY_CARE_PROVIDER_SITE_OTHER): Payer: Self-pay | Admitting: Internal Medicine

## 2009-12-06 ENCOUNTER — Ambulatory Visit: Payer: Self-pay | Admitting: Internal Medicine

## 2009-12-06 DIAGNOSIS — G4700 Insomnia, unspecified: Secondary | ICD-10-CM | POA: Insufficient documentation

## 2009-12-27 ENCOUNTER — Ambulatory Visit: Payer: Self-pay | Admitting: Internal Medicine

## 2009-12-27 LAB — CONVERTED CEMR LAB
Cholesterol: 176 mg/dL (ref 0–200)
HDL: 32 mg/dL — ABNORMAL LOW (ref >39)
LDL Cholesterol: 96 mg/dL (ref 0–99)
Total CHOL/HDL Ratio: 5.5
Triglycerides: 242 mg/dL — ABNORMAL HIGH (ref <150)
VLDL: 48 mg/dL — ABNORMAL HIGH (ref 0–40)

## 2010-01-03 ENCOUNTER — Telehealth (INDEPENDENT_AMBULATORY_CARE_PROVIDER_SITE_OTHER): Payer: Self-pay | Admitting: Internal Medicine

## 2010-02-01 ENCOUNTER — Telehealth (INDEPENDENT_AMBULATORY_CARE_PROVIDER_SITE_OTHER): Payer: Self-pay | Admitting: Internal Medicine

## 2010-02-26 ENCOUNTER — Telehealth (INDEPENDENT_AMBULATORY_CARE_PROVIDER_SITE_OTHER): Payer: Self-pay | Admitting: *Deleted

## 2010-02-28 ENCOUNTER — Emergency Department (HOSPITAL_COMMUNITY): Admission: EM | Admit: 2010-02-28 | Discharge: 2010-02-28 | Payer: Self-pay | Admitting: Emergency Medicine

## 2010-04-02 ENCOUNTER — Telehealth (INDEPENDENT_AMBULATORY_CARE_PROVIDER_SITE_OTHER): Payer: Self-pay | Admitting: Internal Medicine

## 2010-04-12 ENCOUNTER — Ambulatory Visit
Admission: RE | Admit: 2010-04-12 | Discharge: 2010-04-12 | Payer: Self-pay | Source: Home / Self Care | Attending: Internal Medicine | Admitting: Internal Medicine

## 2010-04-13 ENCOUNTER — Encounter (INDEPENDENT_AMBULATORY_CARE_PROVIDER_SITE_OTHER): Payer: Self-pay | Admitting: Internal Medicine

## 2010-04-26 ENCOUNTER — Other Ambulatory Visit (HOSPITAL_COMMUNITY): Payer: Self-pay | Admitting: Internal Medicine

## 2010-04-26 ENCOUNTER — Ambulatory Visit
Admission: RE | Admit: 2010-04-26 | Discharge: 2010-04-26 | Payer: Self-pay | Source: Home / Self Care | Attending: Internal Medicine | Admitting: Internal Medicine

## 2010-04-26 DIAGNOSIS — Z1231 Encounter for screening mammogram for malignant neoplasm of breast: Secondary | ICD-10-CM

## 2010-04-26 DIAGNOSIS — Z139 Encounter for screening, unspecified: Secondary | ICD-10-CM

## 2010-04-26 LAB — CONVERTED CEMR LAB: Blood Glucose, Fingerstick: 95

## 2010-04-28 ENCOUNTER — Encounter: Payer: Self-pay | Admitting: Obstetrics

## 2010-04-29 LAB — CONVERTED CEMR LAB
Amphetamine Screen, Ur: NEGATIVE
Barbiturate Quant, Ur: NEGATIVE
Benzodiazepines.: NEGATIVE
Cocaine Metabolites: NEGATIVE
Creatinine,U: 223.3 mg/dL
Marijuana Metabolite: NEGATIVE
Methadone: NEGATIVE
Opiate Screen, Urine: POSITIVE — AB
Phencyclidine (PCP): NEGATIVE
Propoxyphene: NEGATIVE

## 2010-05-02 ENCOUNTER — Telehealth (INDEPENDENT_AMBULATORY_CARE_PROVIDER_SITE_OTHER): Payer: Self-pay | Admitting: Internal Medicine

## 2010-05-03 ENCOUNTER — Ambulatory Visit
Admission: RE | Admit: 2010-05-03 | Discharge: 2010-05-03 | Payer: Self-pay | Source: Home / Self Care | Attending: Internal Medicine | Admitting: Internal Medicine

## 2010-05-07 NOTE — Letter (Signed)
Summary: Generic Letter  [facility]  [address] [place], [place] [address]   Phone: [phone]  Fax: [phone]    05/08/2009  Christie Cervantes [address] [place], [place]  [address]  Dear [name],  I was disappointed not to see you n 05/08/09 at your appointed time.  To continue receiving your prescriptions, it is very important you make your appointments.  Please call to make an appt. with me in the next 2 months so that you may continue to get your meds filled           Sincerely,   Julieanne Manson MD

## 2010-05-07 NOTE — Progress Notes (Signed)
  Medications Added MS CONTIN 60 MG XR12H-TAB (MORPHINE SULFATE) 1 tab by mouth every 12 hours       Phone Note Call from Patient   Summary of Call: PATIENT RX FOR VICODIN NEEDS REFILL.  [facility], DON'T WANT REFILL ON MORPHINE. PATIENT HAS AN APPOINTMENT AT 2:45pm ON THURSDAY. Initial call taken by: CMA STUDENT KENYATTA  Follow-up for Phone Call        Pt. just doesn't feel right on MS Contin--would like to return to the hydrocodone, but would like to see if could have without the Tylenol.  States she can do more with the Vicodin.  Off MS contin now for 1 week, however, and having much more pain.  Still, just doesn't like the way she feels. Discussed can give hydrocodone with ibuprofen, which she is already using, but she wants the 10 mg of hydrocodone, not 7.5.  Next, she would like to switch to oxycodone--discussed would not do that --either back to hydrocodone or increase the MS Contin--pt. agrees to the latter. Follow-up by: Julieanne Manson MD,  December 04, 2009 5:04 PM    New/Updated Medications: MS CONTIN 60 MG XR12H-TAB (MORPHINE SULFATE) 1 tab by mouth every 12 hours Prescriptions: HYDROCODONE-ACETAMINOPHEN 7.5-500 MG TABS (HYDROCODONE-ACETAMINOPHEN) 1 tab by mouth every 6 hours as needed for breakthrough pain  #30 x 0   Entered and Authorized by:   Julieanne Manson MD   Signed by:   Julieanne Manson MD on 12/04/2009   Method used:   Printed then faxed to ...       [facility] [address] [phone]* (retail)       [address]       [place], [place]  [address]       Ph: [phone] or [phone]       Fax: [phone]   RxID:   [id number] MS CONTIN 60 MG XR12H-TAB (MORPHINE SULFATE) 1 tab by mouth every 12 hours  #60 x 0   Entered and Authorized by:   Julieanne Manson MD   Signed by:   Julieanne Manson MD on 12/04/2009   Method used:   Print then Give to Patient   RxID:   [id number]

## 2010-05-07 NOTE — Progress Notes (Signed)
Summary: REFILL HYDROCODONE & MS CONTIN  Phone Note Call from Patient   Caller: Patient Reason for Call: Refill Medication Summary of Call: PT NEED A REFILL OF HYDROCODONE 7.5  500 MG & MS CONTIN 30MG  . PLEASE,CALL HER TO PICK UP THE RX  [phone] [facility] YOU  Initial call taken by: Cheryll Dessert,  October 02, 2009 10:21 AM  Follow-up for Phone Call        Pt got each filled last on 09/04/09. Follow-up by: Vesta Mixer CMA,  October 02, 2009 10:54 AM  Additional Follow-up for Phone Call Additional follow up Details #1::        The pt called back again because she needs refills from hydrocodone 7.5-500 mg and ms-contin 30 mg (morphine) You can reach the pt at [phone] [facility]  October 02, 2009 12:31 PM  Fill at end of week.  Julieanne Manson MD  October 02, 2009 5:02 PM     Prescriptions: MS CONTIN 30 MG XR12H-TAB (MORPHINE SULFATE) 1 tab by mouth every 12 hours  #60 x 0   Entered and Authorized by:   Julieanne Manson MD   Signed by:   Julieanne Manson MD on 10/05/2009   Method used:   Print then Give to Patient   RxID:   [id number] HYDROCODONE-ACETAMINOPHEN 7.5-500 MG TABS (HYDROCODONE-ACETAMINOPHEN) 1 tab by mouth every 6 hours as needed for breakthrough pain  #60 x 0   Entered and Authorized by:   Julieanne Manson MD   Signed by:   Julieanne Manson MD on 10/05/2009   Method used:   Print then Give to Patient   RxID:   [id number]   Appended Document: REFILL HYDROCODONE & MS CONTIN     Clinical Lists Changes  Medications: Rx of HYDROCODONE-ACETAMINOPHEN 7.5-500 MG TABS (HYDROCODONE-ACETAMINOPHEN) 1 tab by mouth every 6 hours as needed for breakthrough pain;  #60 x 0;  Signed;  Entered by: Julieanne Manson MD;  Authorized by: Julieanne Manson MD;  Method used: Print then Give to Patient Rx of MS CONTIN 30 MG XR12H-TAB (MORPHINE SULFATE) 1 tab by mouth every 12 hours;  #60 x 0;  Signed;  Entered by: Julieanne Manson MD;  Authorized by: Julieanne Manson MD;  Method used: Print then Give to Patient    Prescriptions: MS CONTIN 30 MG XR12H-TAB (MORPHINE SULFATE) 1 tab by mouth every 12 hours  #60 x 0   Entered and Authorized by:   Julieanne Manson MD   Signed by:   Julieanne Manson MD on 10/05/2009   Method used:   Print then Give to Patient   RxID:   [id number] HYDROCODONE-ACETAMINOPHEN 7.5-500 MG TABS (HYDROCODONE-ACETAMINOPHEN) 1 tab by mouth every 6 hours as needed for breakthrough pain  #60 x 0   Entered and Authorized by:   Julieanne Manson MD   Signed by:   Julieanne Manson MD on 10/05/2009   Method used:   Print then Give to Patient   RxID:   [id number]  paper in printer backwards--repeated.

## 2010-05-07 NOTE — Assessment & Plan Note (Signed)
Summary: WANTS TO CHANGE MED//KT   Vital Signs:  Patient profile:   55 year old female Menstrual status:  hysterectomy Weight:      148 pounds Temp:     98.9 degrees F Pulse rate:   96 / minute Pulse rhythm:   regular Resp:     18 per minute BP sitting:   108 / 75  (left arm) Cuff size:   regular  Vitals Entered By: Vesta Mixer CMA (Sep 04, 2009 2:56 PM) CC: Wants to dicuss changing her pain med and blood work results. Is Patient Diabetic? No  Does patient need assistance? Ambulation Normal   CC:  Wants to dicuss changing her pain med and blood work results..  History of Present Illness: 1.  Wants to go up on pain meds--hydrocodone not lasting 6 hours--on upper limit dosing currently.  Focusing on Percocet.  Not really interested in long acting pain medication.  Allergies: 1)  Compazine 2)  * Antihistamines  Physical Exam  General:  NAD   Impression & Recommendations:  Problem # 1:  DEGENERATIVE DISC DISEASE, LUMBOSACRAL SPINE (ICD-722.52) Changing to long acting MS Contin. Decrease Hydrocodone for breakthrough--plan to wean off over time  Problem # 2:  DYSLIPIDEMIA (ICD-272.4) Cholesterol the highest it's been--pt. admits to not eating well.  Complete Medication List: 1)  Clonazepam 0.5 Mg Tabs (Clonazepam) .Marland Kitchen.. 1 tab by mouth three times a day dr. Hortencia Pilar 2)  Toprol Xl 50 Mg Tb24 (Metoprolol succinate) .Marland Kitchen.. 1 by mouth once daily 3)  Hydrocodone-acetaminophen 7.5-500 Mg Tabs (Hydrocodone-acetaminophen) .Marland Kitchen.. 1 tab by mouth every 6 hours as needed for breakthrough pain 4)  Fluoxetine Hcl 20 Mg Caps (Fluoxetine hcl) .... 2 caps by mouth daily dr. Hortencia Pilar 5)  Ibuprofen 800 Mg Tabs (Ibuprofen) .Marland Kitchen.. 1 tab by mouth two times a day with meals. 6)  Amitriptyline Hcl 50 Mg Tabs (Amitriptyline hcl) .... Take 1 tab by mouth at bedtime and increase to 2 as tolerated  dr. Hortencia Pilar 7)  Ms Contin 30 Mg Xr12h-tab (Morphine sulfate) .Marland Kitchen.. 1 tab by mouth every 12  hours  Patient Instructions: 1)  Follow up with Dr. Delrae Alfred in 3 months --pain control 2)  Schedule FLP --fasting lab in 4 months Prescriptions: HYDROCODONE-ACETAMINOPHEN 7.5-500 MG TABS (HYDROCODONE-ACETAMINOPHEN) 1 tab by mouth every 6 hours as needed for breakthrough pain  #60 x 0   Entered and Authorized by:   Julieanne Manson MD   Signed by:   Julieanne Manson MD on 09/04/2009   Method used:   Print then Give to Patient   RxID:   [id number] MS CONTIN 30 MG XR12H-TAB (MORPHINE SULFATE) 1 tab by mouth every 12 hours  #60 x 0   Entered and Authorized by:   Julieanne Manson MD   Signed by:   Julieanne Manson MD on 09/04/2009   Method used:   Print then Give to Patient   RxID:   [id number]

## 2010-05-07 NOTE — Letter (Signed)
Summary: *Referral Letter  [facility]  [address] [place], [place] [address]   Phone: [phone]  Fax: [phone]    09/05/2009  Thank you in advance for agreeing to see my patient:  Christie Cervantes [address] [place], [place]  [address]  Phone: [phone]  Reason for Referral: Screening colonoscopy    Current Medical Problems: 1)  BACTERIAL VAGINITIS (ICD-616.10) 2)  DYSURIA (ICD-788.1) 3)  HEALTH SCREENING (ICD-V70.0) 4)  ROUTINE GYNECOLOGICAL EXAMINATION (ICD-V72.31) 5)  DENTAL CARIES (ICD-521.00) 6)  ENCOUNTER FOR LONG-TERM USE OF OTHER MEDICATIONS (ICD-V58.69) 7)  DEGENERATIVE DISC DISEASE, LUMBOSACRAL SPINE (ICD-722.52) 8)  CARPAL TUNNEL SYNDROME, BILATERAL (ICD-354.0) 9)  DYSLIPIDEMIA (ICD-272.4) 10)  MIGRAINE HEADACHE (ICD-346.90) 11)  DEPRESSION, SEVERE (ICD-311) 12)  HYPERTENSION (ICD-401.9)   Current Medications: 1)  CLONAZEPAM 0.5 MG  TABS (CLONAZEPAM) 1 tab by mouth three times a day Dr. Hortencia Pilar 2)  TOPROL XL 50 MG TB24 (METOPROLOL SUCCINATE) 1 by mouth once daily 3)  HYDROCODONE-ACETAMINOPHEN 7.5-500 MG TABS (HYDROCODONE-ACETAMINOPHEN) 1 tab by mouth every 6 hours as needed for breakthrough pain 4)  FLUOXETINE HCL 20 MG CAPS (FLUOXETINE HCL) 2 caps by mouth daily Dr. Hortencia Pilar 5)  IBUPROFEN 800 MG TABS (IBUPROFEN) 1 tab by mouth two times a day with meals. 6)  AMITRIPTYLINE HCL 50 MG TABS (AMITRIPTYLINE HCL) Take 1 tab by mouth at bedtime and increase to 2 as tolerated  Dr. 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  Sincerely,  Julieanne Manson MD

## 2010-05-07 NOTE — Progress Notes (Signed)
Summary: MEDS REFILL   Phone Note Refill Request   Refills Requested: Medication #1:  HYDROCODONE-ACETAMINOPHEN 7.5-500 MG TABS 1 tab by mouth every 6 hours as needed for breakthrough pain  Medication #2:  MS CONTIN 60 MG XR12H-TAB 1 tab by mouth every 12 hours. Pt need a refill please, call pt to pick up rx  [phone]  thank you   Initial call taken by: Cheryll Dessert,  February 26, 2010 3:04 PM  Follow-up for Phone Call        Will fill next week on the 28th Follow-up by: Julieanne Manson MD,  February 27, 2010 10:59 AM  Additional Follow-up for Phone Call Additional follow up Details #1::        May pick up on MOnday, the 28th Additional Follow-up by: Julieanne Manson MD,  February 27, 2010 2:53 PM    Additional Follow-up for Phone Call Additional follow up Details #2::    CALLED PT FOR PICK UP/CRICKET CUSTOMER/COULDN'T REACH/IN DRAWER FOR PICK UP Follow-up by: Arta Bruce,  March 04, 2010 9:53 AM  Prescriptions: MS CONTIN 60 MG XR12H-TAB (MORPHINE SULFATE) 1 tab by mouth every 12 hours  #60 x 0   Entered and Authorized by:   Julieanne Manson MD   Signed by:   Julieanne Manson MD on 02/27/2010   Method used:   Print then Give to Patient   RxID:   [id number] HYDROCODONE-ACETAMINOPHEN 7.5-500 MG TABS (HYDROCODONE-ACETAMINOPHEN) 1 tab by mouth every 6 hours as needed for breakthrough pain  #30 x 0   Entered and Authorized by:   Julieanne Manson MD   Signed by:   Julieanne Manson MD on 02/27/2010   Method used:   Print then Give to Patient   RxID:   [id number]

## 2010-05-07 NOTE — Progress Notes (Signed)
Summary: MEDS (AMOXICILLIN)   Phone Note Call from Patient   Caller: Patient Complaint: Breathing Problems Summary of Call: PT WANTS DR MULBERRY TO PRESCRIBE AMOXICILLIN (TOOTHACHE) RX [facility] PH # [phone] AND SHE IS AWARE THAT DR MULBERRY IS OUT .HER PH # [phone] THANK YOU  AND ALSO SHE SAW [facility] YESTERDAY AND SHE IS GOING TO REFERRAL TO THE DENTAL CLINIC .  Initial call taken by: Cheryll Dessert,  November 23, 2009 11:33 AM  Follow-up for Phone Call        pt indicated that she broke a tooth (in previous phone note) which usually does not require antibiotics.  Dental referral done Unless she has an abscess (which would need confirmation by at least a triage nurse visit) no need for antibiotic she needs to eat soft food Follow-up by: Lehman Prom FNP,  November 23, 2009 12:08 PM  Additional Follow-up for Phone Call Additional follow up Details #1::        Left message on answering machine for pt. to return call.  Dutch Quint RN  November 23, 2009 3:05 PM     Additional Follow-up for Phone Call Additional follow up Details #2::    She states that the tooth is broken in the gum "it looks ugly" -- not sure if it's infected or not; advised of provider's recommendations.  States that if it's not better after the weekend, she'll come in to have it looked at. Follow-up by: Dutch Quint RN,  November 23, 2009 3:31 PM

## 2010-05-07 NOTE — Progress Notes (Signed)
Summary: FYI  Medications Added CLONAZEPAM 0.5 MG  TABS (CLONAZEPAM) 1/2 two times a day CLONAZEPAM 0.5 MG  TABS (CLONAZEPAM) 1 tab by mouth three times a day Dr. Hortencia Pilar TOPROL XL 50 MG TB24 (METOPROLOL SUCCINATE) 1 by mouth once daily SEROQUEL 200 MG  TABS (QUETIAPINE FUMARATE)  SEROQUEL 200 MG  TABS (QUETIAPINE FUMARATE)  HYDROCODONE-ACETAMINOPHEN 10-650 MG  TABS (HYDROCODONE-ACETAMINOPHEN) 1 by mouth qid HYDROCODONE-ACETAMINOPHEN 10-650 MG  TABS (HYDROCODONE-ACETAMINOPHEN) 1 by mouth four times daily as needed FLUOXETINE HCL 20 MG CAPS (FLUOXETINE HCL) 2 caps by mouth daily FLUOXETINE HCL 20 MG CAPS (FLUOXETINE HCL) 2 caps by mouth daily Dr. Hortencia Pilar IBUPROFEN 800 MG TABS (IBUPROFEN) 1 tab by mouth two times a day with meals. AMOXIL 500 MG CAPS (AMOXICILLIN) 1 capsule by mouth three times a day for infection AMOXIL 500 MG CAPS (AMOXICILLIN) 1 capsule by mouth three times a day for infection AMITRIPTYLINE HCL 50 MG TABS (AMITRIPTYLINE HCL) Take 1 tab by mouth at bedtime and increase to 2 as tolerated  Dr. Hortencia Pilar SULFAMETHOXAZOLE-TMP DS 800-160 MG TABS (SULFAMETHOXAZOLE-TRIMETHOPRIM) 1 tab by mouth two times a day for 3 days SULFAMETHOXAZOLE-TMP DS 800-160 MG TABS (SULFAMETHOXAZOLE-TRIMETHOPRIM) 1 tab by mouth two times a day for 3 days METRONIDAZOLE 500 MG TABS (METRONIDAZOLE) 1 tab by mouth two times a day for 7 days.       Phone Note Call from Patient Call back at [facility] Phone [phone]   Summary of Call: the pt wants to share with the provider that the antibiotic clear up the infection completelly.   Jaiveon Suppes MD Initial call taken by: Manon Hilding,  July 31, 2009 2:10 PM  Follow-up for Phone Call        Thanks will fwd to Dr. Delrae Alfred to let her know. Follow-up by: Vesta Mixer CMA,  July 31, 2009 2:22 PM

## 2010-05-07 NOTE — Progress Notes (Signed)
Summary: PT WANTS DR Juda Toepfer TO CALL BACK  Phone Note Call from Patient   Caller: Patient Reason for Call: Talk to Doctor Summary of Call: PT WANTS DR Mao Lockner TO CALL HER BACK ABOUT HER CULTURE RESULTS  .  [phone]   [facility] YOU  Initial call taken by: Cheryll Dessert,  July 20, 2009 11:45 AM  Follow-up for Phone Call        Results are back, will send to Dr. Delrae Alfred for review. Follow-up by: Vesta Mixer CMA,  July 20, 2009 5:26 PM  Additional Follow-up for Phone Call Additional follow up Details #1::        Called and discussed negative urine culture.  Pt., unfortunately, filled the Bactrim as well as Metronidazole.  No definite change in symptoms.  She will call if worsens or if no improvement by end of treatment with Metronidazole for BV Additional Follow-up by: Julieanne Manson MD,  July 20, 2009 8:23 PM

## 2010-05-07 NOTE — Progress Notes (Signed)
Summary: rx refill   Phone Note Call from Patient Call back at [facility] Phone [phone] Call back at [phone]   Summary of Call: THE PT IS REQUESTING FOR REFILLS FROM HYDROCODONE MEDICATION. [facility] Thelbert Gartin MD Initial call taken by: Manon Hilding,  August 03, 2009 3:58 PM  Follow-up for Phone Call        Last got #120 on 07/03/09. Follow-up by: Vesta Mixer CMA,  August 03, 2009 4:16 PM    Prescriptions: HYDROCODONE-ACETAMINOPHEN 10-650 MG  TABS (HYDROCODONE-ACETAMINOPHEN) 1 by mouth four times daily as needed  #120 x 0   Entered and Authorized by:   Julieanne Manson MD   Signed by:   Julieanne Manson MD on 08/05/2009   Method used:   Printed then faxed to ...       [facility] [address] [phone]* (retail)       [address]       [place], [place]  [address]       Ph: [phone] or [phone]       Fax: [phone]   RxID:   [id number]

## 2010-05-07 NOTE — Assessment & Plan Note (Signed)
Summary: CPP/TMM   Vital Signs:  Patient profile:   55 year old Cervantes Menstrual status:  hysterectomy Weight:      149.8 pounds BMI:     23.55 Temp:     97.9 degrees F Pulse rate:   90 / minute Pulse rhythm:   regular Resp:     16 per minute BP sitting:   118 / 81  (left arm) Cuff size:   regular  Vitals Entered By: Vesta Mixer CMA (Aug 14, 2009 12:39 PM) CC: CPE Is Patient Diabetic? No Pain Assessment Patient in pain? yes     Location: back Intensity: 7  Does patient need assistance? Ambulation Normal   CC:  CPE.  History of Present Illness: 55 yo Cervantes here for CPP.  Concerns:   1.  Concern she is developing OCD:  washing hands all the time--describes doing this at times when logical to do this, however.  Concerned has bipolar disorder--gets angry easily at times.  No phyical violence associated.  Admits she often will try to come off Fluoxetine.  Has been depressed recently--admits she needs to take the Fluoxetine.  Goes to [facility].  Habits & Providers  Alcohol-Tobacco-Diet     Alcohol drinks/day: 0     Tobacco Status: current     Cigarette Packs/Day: 1.0     Year Started: 55 yo  Exercise-Depression-Behavior     Drug Use: never  Current Medications (verified): 1)  Clonazepam 0.5 Mg  Tabs (Clonazepam) .Marland Kitchen.. 1 Tab By Mouth Three Times A Day Dr. Hortencia Pilar 2)  Toprol Xl 50 Mg Tb24 (Metoprolol Succinate) .Marland Kitchen.. 1 By Mouth Once Daily 3)  Hydrocodone-Acetaminophen 10-650 Mg  Tabs (Hydrocodone-Acetaminophen) .Marland Kitchen.. 1 By Mouth Four Times Daily As Needed 4)  Fluoxetine Hcl 20 Mg Caps (Fluoxetine Hcl) .... 2 Caps By Mouth Daily Dr. Hortencia Pilar 5)  Ibuprofen 800 Mg Tabs (Ibuprofen) .Marland Kitchen.. 1 Tab By Mouth Two Times A Day With Meals. 6)  Amitriptyline Hcl 50 Mg Tabs (Amitriptyline Hcl) .... Take 1 Tab By Mouth At Bedtime and Increase To 2 As Tolerated  Dr. Hortencia Pilar 7)  Metronidazole 500 Mg Tabs (Metronidazole) .Marland Kitchen.. 1 Tab By Mouth Two Times A Day For 7 Days.  Allergies  (verified): 1)  Compazine 2)  * Antihistamines  Past History:  Past Medical History: Last updated: 11/16/2006 DEGENERATIVE DISC DISEASE, LUMBOSACRAL SPINE (ICD-722.52) CARPAL TUNNEL SYNDROME, BILATERAL (ICD-354.0) DYSLIPIDEMIA (ICD-272.4) MIGRAINE HEADACHE (ICD-346.90) DEPRESSION, SEVERE (ICD-311) HYPERTENSION (ICD-401.9)  Past Surgical History: Last updated: 07/14/2008 1.  Hx Fibroidectomy 2.  2002 Hysterectomy and USO, benign reasons 3.  1971:  Appendectomy  Family History: Reviewed history from 07/14/2008 and no changes required. Mother, died 67:  Multiple myeloma.   Father, 50:  Hypertension.  Back problems, gout.  Renal failure--from htn. 4 sisters:  2 sisters with DM.  1 sister with hypertension. 8 Brothers:  1 brother with DM, 1 with hypertension. 1 adopted child    Social History: Reviewed history from 07/14/2008 and no changes required. Widowed 1991 1 adopted daughter, age 66 Lives alone and often cares for granddaughter. Previously did data entry, administrative work.   Now on disability for back. Current Smoker.  Started age 56.  1 ppd currently Alcohol use-no Drug use-no Packs/Day:  1.0 Drug Use:  never  Review of Systems General:  Energy okay once up and about. Eyes:  Needs glasses.  Did have glasses for driving at night.. ENT:  Denies decreased hearing. CV:  Denies chest pain or discomfort and palpitations. Resp:  Denies shortness of breath. GI:  Denies abdominal pain, bloody stools, constipation, dark tarry stools, and diarrhea. GU:  Denies discharge, dysuria, genital sores, and urinary frequency; hysterectomy for fibroids--has one ovary. MS:  tenderness in wrists at times with carpal tunnel syndrome.  Does not have cock up splint for right hand/wrist.. Derm:  Denies lesion(s) and rash. Neuro:  hands with CTS. Psych:  See HPI.  PHQ 9 scored a 3.  Physical Exam  General:  Well-developed,well-nourished,in no acute distress; alert,appropriate and  cooperative throughout examination.  Appears much younger than stated age. Head:  Normocephalic and atraumatic without obvious abnormalities. No apparent alopecia or balding. Eyes:  No corneal or conjunctival inflammation noted. EOMI. Perrla. Funduscopic exam benign, without hemorrhages, exudates or papilledema. Vision grossly normal. Ears:  External ear exam shows no significant lesions or deformities.  Otoscopic examination reveals clear canals, tympanic membranes are intact bilaterally without bulging, retraction, inflammation or discharge. Hearing is grossly normal bilaterally. Nose:  External nasal examination shows no deformity or inflammation. Nasal mucosa are pink and moist without lesions or exudates. Mouth:  Oral mucosa and oropharynx without lesions or exudates.  Teeth in good repair. Neck:  No deformities, masses, or tenderness noted. Breasts:  No mass, nodules, thickening, tenderness, bulging, retraction, inflamation, nipple discharge or skin changes noted.  Bilateral nipples inverted--chronic per pt. Lungs:  Normal respiratory effort, chest expands symmetrically. Lungs are clear to auscultation, no crackles or wheezes. Heart:  Normal rate and regular rhythm. S1 and S2 normal without gallop, murmur, click, rub or other extra sounds. Abdomen:  Bowel sounds positive,abdomen soft and non-tender without masses, organomegaly or hernias noted.  Well healed low transverse surgical scar. Rectal:  No external abnormalities noted. Normal sphincter tone. No rectal masses or tenderness.  Heme negative light brown stool Genitalia:  Pelvic Exam:        External: normal Cervantes genitalia without lesions or masses        Vagina: normal without lesions or masses                Adnexa: normal bimanual exam without masses or fullness or tenderness                Pap smear: not performed Msk:  No deformity or scoliosis noted of thoracic or lumbar spine.   Pulses:  R and L carotid,radial,femoral,dorsalis  pedis and posterior tibial pulses are full and equal bilaterally Extremities:  No clubbing, cyanosis, edema, or deformity noted with normal full range of motion of all joints.   Neurologic:  No cranial nerve deficits noted. Station and gait are normal. Plantar reflexes are down-going bilaterally. DTRs are symmetrical throughout. Sensory, motor and coordinative functions appear intact. Skin:  Intact without suspicious lesions or rashes Cervical Nodes:  No lymphadenopathy noted Axillary Nodes:  No palpable lymphadenopathy Inguinal Nodes:  No significant adenopathy Psych:  Cognition and judgment appear intact. Alert and cooperative with normal attention span and concentration. No apparent delusions, illusions, hallucinations   Impression & Recommendations:  Problem # 1:  ROUTINE GYNECOLOGICAL EXAMINATION (ICD-V72.31) Mammogram done Orders: UA Dipstick w/o Micro (manual) (16109) T-GC Probe, urine ([id number]) T-Chlamydia  Probe, urine ([id number]) T-HIV Antibody  (Reflex) ([id number]) T-Syphilis Test (RPR) ([id number])  Problem # 2:  HEALTH SCREENING (ICD-V70.0)  Guaiac cards x 3 to  return in 2 weeks.  Orders: Gastroenterology Referral (GI)  Problem # 3:  HYPERTENSION (ICD-401.9) controlled Her updated medication list for this problem includes:    Toprol Xl 50 Mg Tb24 (Metoprolol succinate) .Marland KitchenMarland KitchenMarland KitchenMarland Kitchen  1 by mouth once daily  Orders: UA Dipstick w/o Micro (manual) (85462)  Problem # 4:  DYSLIPIDEMIA (ICD-272.4)  Orders: T-Lipid Profile ([id number])  Problem # 5:  DEPRESSION, SEVERE (ICD-311) Encouraged pt. to take her antidepressant Her updated medication list for this problem includes:    Clonazepam 0.5 Mg Tabs (Clonazepam) .Marland Kitchen... 1 tab by mouth three times a day dr. Hortencia Pilar    Fluoxetine Hcl 20 Mg Caps (Fluoxetine hcl) .Marland Kitchen... 2 caps by mouth daily dr. Hortencia Pilar    Amitriptyline Hcl 50 Mg Tabs (Amitriptyline hcl) .Marland Kitchen... Take 1 tab by mouth at bedtime and increase to 2 as  tolerated  dr. Hortencia Pilar  Complete Medication List: 1)  Clonazepam 0.5 Mg Tabs (Clonazepam) .Marland Kitchen.. 1 tab by mouth three times a day dr. Hortencia Pilar 2)  Toprol Xl 50 Mg Tb24 (Metoprolol succinate) .Marland Kitchen.. 1 by mouth once daily 3)  Hydrocodone-acetaminophen 10-650 Mg Tabs (Hydrocodone-acetaminophen) .Marland Kitchen.. 1 by mouth four times daily as needed 4)  Fluoxetine Hcl 20 Mg Caps (Fluoxetine hcl) .... 2 caps by mouth daily dr. Hortencia Pilar 5)  Ibuprofen 800 Mg Tabs (Ibuprofen) .Marland Kitchen.. 1 tab by mouth two times a day with meals. 6)  Amitriptyline Hcl 50 Mg Tabs (Amitriptyline hcl) .... Take 1 tab by mouth at bedtime and increase to 2 as tolerated  dr. Hortencia Pilar 7)  Metronidazole 500 Mg Tabs (Metronidazole) .Marland Kitchen.. 1 tab by mouth two times a day for 7 days.  Other Orders: T-Comprehensive Metabolic Panel [phone]) T-CBC w/Diff [phone])  Patient Instructions: 1)  Follow up with Dr. Delrae Alfred in 6 months --htn, back pain, depression  Preventive Care Screening  Prior Values:    Pap Smear:  normal-per pt report.  Dr. Clearance Coots (04/20/2008)    Mammogram:  ASSESSMENT: Negative - BI-RADS 1^MM DIGITAL SCREENING (07/04/2009)    Hemoccult:  Negative (12/09/2006)    Last Tetanus Booster:  Historical (12/08/2005)     SBE:  occasionally.   Osteoprevention:  1 serving of cheese daily.  Exercises daily. Colonoscopy:  never.   Laboratory Results  Date/Time Received: Aug 14, 2009 2:21 PM  Date/Time Reported: Aug 14, 2009 2:21 PM   Other Tests  Rapid HIV: negative

## 2010-05-07 NOTE — Progress Notes (Signed)
Summary: Office Visit/DEPRESSION SCREENING  Office Visit/DEPRESSION SCREENING   Imported By: Arta Bruce 08/15/2009 11:05:27  _____________________________________________________________________  External Attachment:    Type:   Image     Comment:   External Document

## 2010-05-07 NOTE — Progress Notes (Signed)
Summary: GI referral   Phone Note Outgoing Call   Summary of Call: Debra:  GI referral for screening colonoscopy--order written along with letter.   Initial call taken by: Julieanne Manson MD,  September 05, 2009 12:17 AM

## 2010-05-07 NOTE — Letter (Signed)
Summary: DENTAL REFERRAL  DENTAL REFERRAL   Imported By: Arta Bruce 11/23/2009 15:00:54  _____________________________________________________________________  External Attachment:    Type:   Image     Comment:   External Document

## 2010-05-07 NOTE — Assessment & Plan Note (Signed)
Summary: PAIN BOTH HANDS//GK   Vital Signs:  Patient profile:   55 year old female Menstrual status:  hysterectomy Weight:      151.9 pounds Pulse rhythm:   regular BP sitting:   141 / 94  (left arm) Cuff size:   regular  Vitals Entered By: Vesta Mixer CMA (February 02, 2009 3:12 PM) Comments Pt not  currently eligibile.  Graciela to make appt.  Pt requesting toprol, ibuprofen and vicodin be sent to [facility] until her appt............. Tiffany McCoy CMA  February 02, 2009 3:18 PM    Allergies: 1)  Compazine 2)  * Antihistamines   Complete Medication List: 1)  Clonazepam 0.5 Mg Tabs (Clonazepam) .Marland Kitchen.. 1 tab by mouth three times a day dr. Hortencia Pilar 2)  Toprol Xl 50 Mg Tb24 (Metoprolol succinate) .Marland Kitchen.. 1 by mouth once daily 3)  Hydrocodone-acetaminophen 10-650 Mg Tabs (Hydrocodone-acetaminophen) .Marland Kitchen.. 1 by mouth four times daily as needed 4)  Fluoxetine Hcl 20 Mg Caps (Fluoxetine hcl) .... 2 caps by mouth daily dr. Hortencia Pilar 5)  Ibuprofen 800 Mg Tabs (Ibuprofen) .Marland Kitchen.. 1 tab by mouth two times a day with meals. 6)  Amitriptyline Hcl 50 Mg Tabs (Amitriptyline hcl) .... Take 1 tab by mouth at bedtime and increase to 2 as tolerated  dr. Hortencia Pilar Prescriptions: HYDROCODONE-ACETAMINOPHEN 10-650 MG  TABS (HYDROCODONE-ACETAMINOPHEN) 1 by mouth four times daily as needed  #120 x 0   Entered and Authorized by:   Julieanne Manson MD   Signed by:   Julieanne Manson MD on 02/02/2009   Method used:   Printed then faxed to ...       [facility] [address] [phone][facility] (retail)       [address]       [place], [place]  [address]       Ph: [phone] or [phone]       Fax: [phone]   RxID:   [id number] IBUPROFEN 800 MG TABS (IBUPROFEN) 1 tab by mouth two times a day with meals.  #60 x 4   Entered and Authorized by:   Julieanne Manson MD   Signed by:   Julieanne Manson MD on 02/02/2009   Method used:   Electronically to        [facility] [address]  [phone]* (retail)       [address]       [place], [place]  [address]       Ph: [phone] or [phone]       Fax: [phone]   RxID:   [id number]

## 2010-05-07 NOTE — Progress Notes (Signed)
Summary: Wants her pain medication changed   Phone Note Call from Patient   Summary of Call: Ms. Christie Cervantes wants the provider to change her pain medication hydrocodone 10-650 to percocet 10-650 because she beleves it will help her with the pain perhaps because she has been with hydrocodone for about 5 years.  Please call [phone]. because pain medication is due.  ([facility] [phone]) Delrae Alfred MD  Initial call taken by: Manon Hilding,  Aug 31, 2009 11:15 AM  Follow-up for Phone Call        PATIENT IS SCHEDULE TO SEE Clorinda Wyble ON TUESDAY 05/31 TO DISCUSS MED CHANGE AND LAB RESULTS Follow-up by: Leodis Rains,  Aug 31, 2009 5:03 PM

## 2010-05-07 NOTE — Progress Notes (Signed)
   Phone Note Call from Patient Call back at [facility] Phone [phone] Call back at [phone]   Summary of Call: The pt needs the provider to call in for the next 2 months her bp toprol xl  medication to [facility].  Symphanie Cederberg MD Initial call taken by: Manon Hilding,  Aug 07, 2009 11:58 AM  Follow-up for Phone Call        Pt does have refills still through Oct, so called to [facility] pharmacy for pt. Pt aware. Follow-up by: Vesta Mixer CMA,  Aug 08, 2009 11:14 AM

## 2010-05-07 NOTE — Letter (Signed)
Summary: REFERRRAL//NUTRITION //APPT DATE & TIME  REFERRRAL//NUTRITION //APPT DATE & TIME   Imported By: Arta Bruce 09/07/2009 11:47:30  _____________________________________________________________________  External Attachment:    Type:   Image     Comment:   External Document

## 2010-05-07 NOTE — Progress Notes (Signed)
Summary: Pain refills   Phone Note Call from Patient Call back at [phone]   Summary of Call: The pt needs refills from oxycodone and hydrocodone.    Makyiah Lie MD Initial call taken by: Manon Hilding,  November 01, 2009 10:42 AM  Follow-up for Phone Call        Pt last got refills of her pain meds on 10/05/09. Follow-up by: Vesta Mixer CMA,  November 02, 2009 9:36 AM  Additional Follow-up for Phone Call Additional follow up Details #1::        Called and spoke with pt.--needs to start weaning hydrocodone--will take down to 30 tabs per month with plan to ultimately get to 0. She will make an appt. with me in 1-2 months to see how she is doing with pain control--may need to increase MS contin Additional Follow-up by: Julieanne Manson MD,  November 02, 2009 2:17 PM    Prescriptions: MS CONTIN 30 MG XR12H-TAB (MORPHINE SULFATE) 1 tab by mouth every 12 hours  #60 x 0   Entered and Authorized by:   Julieanne Manson MD   Signed by:   Julieanne Manson MD on 11/02/2009   Method used:   Print then Give to Patient   RxID:   [id number] HYDROCODONE-ACETAMINOPHEN 7.5-500 MG TABS (HYDROCODONE-ACETAMINOPHEN) 1 tab by mouth every 6 hours as needed for breakthrough pain  #30 x 0   Entered and Authorized by:   Julieanne Manson MD   Signed by:   Julieanne Manson MD on 11/02/2009   Method used:   Print then Give to Patient   RxID:   [id number]

## 2010-05-07 NOTE — Progress Notes (Signed)
Summary: Dental Referral   Phone Note Call from Patient Call back at Home Phone [phone]   Summary of Call: pt says she has a cracked tooth and would like to get a referral to dental clinic. Pt states is having pain. Advised we will forward to provider and will contact at a later date. Initial call taken by: Hassell Halim CMA,  November 22, 2009 2:34 PM  Follow-up for Phone Call        ??? does pt want extraction of the tooth? Unsure if dental clinic repairs teeth, ie chipped teeth.   Thought they only extracted teeth - can you check Arna Medici? Follow-up by: Lehman Prom FNP,  November 22, 2009 2:52 PM  Additional Follow-up for Phone Call Additional follow up Details #1::        The only do extraction ifthey need any other repairs is considered cosmetic   OK. Can you call and let the pt know that the tooth will be removed if she is referred to the dental clinic. There is not dental repair done there.  If she still wants referral i can order but she will be added to the wait list [email or web address] 3:27 PM  November 22, 2009   Additional Follow-up by: Cheryll Dessert,  November 22, 2009 3:14 PM    Additional Follow-up for Phone Call Additional follow up Details #2::    MS [place] SHE RETURN MY CALL AND SHE WANTS A DENTAL REFERRAL   Follow-up by: Cheryll Dessert,  November 23, 2009 11:31 AM  Additional Follow-up for Phone Call Additional follow up Details #3:: Details for Additional Follow-up Action Taken: referral ordered fax to dental clinic  ok Noted .Marland KitchenCheryll Dessert  November 23, 2009 12:43 PM  Additional Follow-up by: Lehman Prom FNP,  November 23, 2009 12:07 PM

## 2010-05-07 NOTE — Progress Notes (Signed)
Summary: med refills   Phone Note Call from Patient   Summary of Call: Pt's eligibility is expired her appt is 07/12/09, but she is trying to do walk in as well she is scheduled for a cpp with you on 07/17/09, but needs refills of her metoprolol, ibuprofen and hydorcodone sent [facility]. Initial call taken by: Vesta Mixer CMA,  June 05, 2009 9:04 AM  Follow-up for Phone Call        She should already have refills of ibuprofen and Metoprolol at the pharmacy--she just needs to call in to get.  Will fill the hydrocodone Follow-up by: Julieanne Manson MD,  June 05, 2009 10:04 AM  Additional Follow-up for Phone Call Additional follow up Details #1::        [facility] Additional Follow-up by: Arta Bruce,  June 05, 2009 10:43 AM    Prescriptions: HYDROCODONE-ACETAMINOPHEN 10-650 MG  TABS (HYDROCODONE-ACETAMINOPHEN) 1 by mouth four times daily as needed  #120 x 0   Entered and Authorized by:   Julieanne Manson MD   Signed by:   Julieanne Manson MD on 06/05/2009   Method used:   Printed then faxed to ...       [facility] [address] [phone]* (retail)       [address]       [place], [place]  [address]       Ph: [phone] or [phone]       Fax: [phone]   RxID:   [id number]

## 2010-05-07 NOTE — Assessment & Plan Note (Signed)
Summary: UTI///KT   Vital Signs:  Patient profile:   55 year old female Menstrual status:  hysterectomy Weight:      148.50 pounds Temp:     98.1 degrees F Pulse rate:   73 / minute Pulse rhythm:   regular Resp:     12 per minute BP sitting:   121 / 87  (left arm) Cuff size:   regular  Vitals Entered By: Chauncy Passy, SMA CC: Pt. is here for a poss. UTI. Pt. states she has noticed a periodically d/c w/ a  burning sensation.  Is Patient Diabetic? No Pain Assessment Patient in pain? yes     Location: back Intensity: 7 Type: aching Onset of pain  Constant  Does patient need assistance? Functional Status Self care Ambulation Normal   CC:  Pt. is here for a poss. UTI. Pt. states she has noticed a periodically d/c w/ a  burning sensation. Marland Kitchen  History of Present Illness: 1. Pt. missed time for CPP today, but was having urinary complaints.  Having mild stinging with urination for past 3 days as well as pressure "inside"  with urination.  No increased frequency in urination.  No definite vaginal discharge--maybe some odor, but mild.  Not drinking lots of fluids.  No fever or flank pain.  2.  Chronic Back Pain:  Taking Hydrocodone 4 times daily and ibuprofen twice daily.  That seems to be controlling her pain well.   Allergies: 1)  Compazine 2)  * Antihistamines  Physical Exam  General:  NAD. Lungs:  Normal respiratory effort, chest expands symmetrically. Lungs are clear to auscultation, no crackles or wheezes. Heart:  Normal rate and regular rhythm. S1 and S2 normal without gallop, murmur, click, rub or other extra sounds.   Abdomen:  Bowel sounds positive,abdomen soft and non-tender without masses, organomegaly or hernias noted.  No flank tenderness or suprapubic, pelvic  tenderness   Impression & Recommendations:  Problem # 1:  BACTERIAL VAGINITIS (ICD-616.10)  Her updated medication list for this problem includes:    Metronidazole 500 Mg Tabs (Metronidazole) .Marland Kitchen... 1  tab by mouth two times a day for 7 days.  Orders: KOH/ WET [place] [phone])  Problem # 2:  DYSURIA (ICD-788.1) Send urine for culture The following medications were removed from the medication list:    Sulfamethoxazole-tmp Ds 800-160 Mg Tabs (Sulfamethoxazole-trimethoprim) .Marland Kitchen... 1 tab by mouth two times a day for 3 days Her updated medication list for this problem includes:    Metronidazole 500 Mg Tabs (Metronidazole) .Marland Kitchen... 1 tab by mouth two times a day for 7 days.  Orders: T-Culture, Urine ([id number]) UA Dipstick w/o Micro (manual) (01027) KOH/ WET [place] [phone])  Problem # 3:  DEGENERATIVE DISC DISEASE, LUMBOSACRAL SPINE (ICD-722.52) Stable on current meds Pain contract in chart.  Complete Medication List: 1)  Clonazepam 0.5 Mg Tabs (Clonazepam) .Marland Kitchen.. 1 tab by mouth three times a day dr. Hortencia Pilar 2)  Toprol Xl 50 Mg Tb24 (Metoprolol succinate) .Marland Kitchen.. 1 by mouth once daily 3)  Hydrocodone-acetaminophen 10-650 Mg Tabs (Hydrocodone-acetaminophen) .Marland Kitchen.. 1 by mouth four times daily as needed 4)  Fluoxetine Hcl 20 Mg Caps (Fluoxetine hcl) .... 2 caps by mouth daily dr. Hortencia Pilar 5)  Ibuprofen 800 Mg Tabs (Ibuprofen) .Marland Kitchen.. 1 tab by mouth two times a day with meals. 6)  Amitriptyline Hcl 50 Mg Tabs (Amitriptyline hcl) .... Take 1 tab by mouth at bedtime and increase to 2 as tolerated  dr. Hortencia Pilar 7)  Metronidazole 500 Mg Tabs (Metronidazole) .Marland KitchenMarland KitchenMarland Kitchen  1 tab by mouth two times a day for 7 days.  Patient Instructions: 1)  Schedule CPP next available with Dr. Delrae Alfred Prescriptions: METRONIDAZOLE 500 MG TABS (METRONIDAZOLE) 1 tab by mouth two times a day for 7 days.  #14 x 0   Entered and Authorized by:   Julieanne Manson MD   Signed by:   Julieanne Manson MD on 07/17/2009   Method used:   Electronically to        [facility] [phone]* (retail)       [address]       [place], [place]  [address]       Ph: [phone]       Fax: [phone]   RxID:    [id number] SULFAMETHOXAZOLE-TMP DS 800-160 MG TABS (SULFAMETHOXAZOLE-TRIMETHOPRIM) 1 tab by mouth two times a day for 3 days  #6 x 0   Entered and Authorized by:   Julieanne Manson MD   Signed by:   Julieanne Manson MD on 07/17/2009   Method used:   Electronically to        [facility] [phone]* (retail)       [address]       [place], [place]  [address]       Ph: [phone]       Fax: [phone]   RxID:   [id number]   Laboratory Results   Urine Tests    Routine Urinalysis   Color: yellow Appearance: Hazy Glucose: negative   (Normal Range: Negative) Bilirubin: negative   (Normal Range: Negative) Ketone: negative   (Normal Range: Negative) Spec. Gravity: >=1.030   (Normal Range: 1.003-1.035) Blood: trace-lysed   (Normal Range: Negative) pH: 5.0   (Normal Range: 5.0-8.0) Protein: 30   (Normal Range: Negative) Urobilinogen: 0.2   (Normal Range: 0-1) Nitrite: negative   (Normal Range: Negative) Leukocyte Esterace: small   (Normal Range: Negative)       Appended Document: UTI///KT     Allergies: 1)  Compazine 2)  * Antihistamines   Complete Medication List: 1)  Clonazepam 0.5 Mg Tabs (Clonazepam) .Marland Kitchen.. 1 tab by mouth three times a day dr. Hortencia Pilar 2)  Toprol Xl 50 Mg Tb24 (Metoprolol succinate) .Marland Kitchen.. 1 by mouth once daily 3)  Hydrocodone-acetaminophen 10-650 Mg Tabs (Hydrocodone-acetaminophen) .Marland Kitchen.. 1 by mouth four times daily as needed 4)  Fluoxetine Hcl 20 Mg Caps (Fluoxetine hcl) .... 2 caps by mouth daily dr. Hortencia Pilar 5)  Ibuprofen 800 Mg Tabs (Ibuprofen) .Marland Kitchen.. 1 tab by mouth two times a day with meals. 6)  Amitriptyline Hcl 50 Mg Tabs (Amitriptyline hcl) .... Take 1 tab by mouth at bedtime and increase to 2 as tolerated  dr. Hortencia Pilar 7)  Metronidazole 500 Mg Tabs (Metronidazole) .Marland Kitchen.. 1 tab by mouth two times a day for 7 days.   Laboratory Results    Wet [place]/KOH Source: self swab WBC/hpf: 5-10 Bacteria/hpf: 3+ Clue cells/hpf:  many  Positive whiff Yeast/hpf: none Trichomonas/hpf: none

## 2010-05-07 NOTE — Progress Notes (Signed)
Summary: PAIN MEDS DUE  Phone Note Call from Patient Call back at Home Phone [phone]   Reason for Call: Refill Medication Complaint: Chest Pain Summary of Call: [facility]. MS Pickup CALLED AHEAD FOR HER MS CONTIN AND HYDROCODONE THAT WILL BE DUE. Initial call taken by: Leodis Rains,  January 03, 2010 10:37 AM  Follow-up for Phone Call        Sent to [facility].  Dutch Quint RN  January 03, 2010 4:03 PM   Additional Follow-up for Phone Call Additional follow up Details #1::        May pick up Rx today (later) let her know her cholesterol was better in most respects--if she would like a copy, please print one off for her. Additional Follow-up by: Julieanne Manson MD,  January 04, 2010 12:14 AM    Additional Follow-up for Phone Call Additional follow up Details #2::    left message that rx was ready to be picked up Follow-up by: Michelle Nasuti,  January 04, 2010 3:59 PM  Additional Follow-up for Phone Call Additional follow up Details #3:: Details for Additional Follow-up Action Taken: [facility] Chantel Miller  January 07, 2010 9:56 AM   Prescriptions: MS CONTIN 60 MG XR12H-TAB (MORPHINE SULFATE) 1 tab by mouth every 12 hours  #60 x 0   Entered and Authorized by:   Julieanne Manson MD   Signed by:   Julieanne Manson MD on 01/04/2010   Method used:   Print then Give to Patient   RxID:   [id number] HYDROCODONE-ACETAMINOPHEN 7.5-500 MG TABS (HYDROCODONE-ACETAMINOPHEN) 1 tab by mouth every 6 hours as needed for breakthrough pain  #30 x 0   Entered and Authorized by:   Julieanne Manson MD   Signed by:   Julieanne Manson MD on 01/04/2010   Method used:   Print then Give to Patient   RxID:   [id number]

## 2010-05-07 NOTE — Progress Notes (Signed)
Summary: MEDS REFILL  Phone Note Refill Request Call back at [phone]   Refills Requested: Medication #1:  MS CONTIN 60 MG XR12H-TAB 1 tab by mouth every 12 hours.  Medication #2:  HYDROCODONE-ACETAMINOPHEN 7.5-500 MG TABS 1 tab by mouth every 6 hours as needed for breakthrough pain PATIENT WILL PICK UP THE RX'S.  Initial call taken by: Domenic Polite,  February 01, 2010 9:08 AM  Follow-up for Phone Call        Please see if Zena Amos is willing to fill so she will not be out on Monday morning--particularly of MS Contin. Follow-up by: Julieanne Manson MD,  February 01, 2010 11:15 AM  Additional Follow-up for Phone Call Additional follow up Details #1::        Rx printed call pt to come pick up Additional Follow-up by: Lehman Prom FNP,  February 01, 2010 4:36 PM    Additional Follow-up for Phone Call Additional follow up Details #2::    PICKED UP SCRIPT TODAY. Follow-up by: Leodis Rains,  February 01, 2010 4:58 PM  Prescriptions: MS CONTIN 60 MG XR12H-TAB (MORPHINE SULFATE) 1 tab by mouth every 12 hours  #60 x 0   Entered and Authorized by:   Lehman Prom FNP   Signed by:   Lehman Prom FNP on 02/01/2010   Method used:   Print then Give to Patient   RxID:   [id number] HYDROCODONE-ACETAMINOPHEN 7.5-500 MG TABS (HYDROCODONE-ACETAMINOPHEN) 1 tab by mouth every 6 hours as needed for breakthrough pain  #30 x 0   Entered and Authorized by:   Lehman Prom FNP   Signed by:   Lehman Prom FNP on 02/01/2010   Method used:   Print then Give to Patient   RxID:   [id number]

## 2010-05-07 NOTE — Assessment & Plan Note (Signed)
Summary: FU ON MEDS////KT   Vital Signs:  Patient profile:   55 year old female Menstrual status:  hysterectomy Height:      67 inches Weight:      152 pounds Temp:     97.4 degrees F oral Pulse rate:   82 / minute Pulse rhythm:   regular Resp:     16 per minute BP sitting:   126 / 82  (left arm) Cuff size:   regular  Vitals Entered By: Michelle Nasuti (December 06, 2009 2:56 PM) CC: ME REFILLS Is Patient Diabetic? No Pain Assessment Patient in pain? yes      Intensity: 6  Does patient need assistance? Functional Status Self care Ambulation Normal   CC:  ME REFILLS.  History of Present Illness: 1.  Chronic low back pain:  Pt. just started on MS Contin yesterday afternoon--becomes obvious that she is not taking appropriatel--just taking here and there--not taking at same time every day.    2.  Insomnia:  pt. out of Klonopin from [facility]--using for sleep and panic attacks. Also using Amitriptyline for sleep--not keeping a regular sleep wake schedule--taking Klonopin and Amitriptyline at 12 a.m.  and not going to bed until 1 a.m.   Discussed poor sleep can magnify pain.  Habits & Providers  Alcohol-Tobacco-Diet     Tobacco Status: current     Cigarette Packs/Day: 0.5  Current Medications (verified): 1)  Clonazepam 0.5 Mg  Tabs (Clonazepam) .Marland Kitchen.. 1 Tab By Mouth Three Times A Day Dr. Hortencia Pilar 2)  Toprol Xl 50 Mg Tb24 (Metoprolol Succinate) .Marland Kitchen.. 1 By Mouth Once Daily 3)  Hydrocodone-Acetaminophen 7.5-500 Mg Tabs (Hydrocodone-Acetaminophen) .Marland Kitchen.. 1 Tab By Mouth Every 6 Hours As Needed For Breakthrough Pain 4)  Fluoxetine Hcl 20 Mg Caps (Fluoxetine Hcl) .... 2 Caps By Mouth Daily Dr. Hortencia Pilar 5)  Ibuprofen 800 Mg Tabs (Ibuprofen) .Marland Kitchen.. 1 Tab By Mouth Two Times A Day With Meals. 6)  Amitriptyline Hcl 50 Mg Tabs (Amitriptyline Hcl) .... Take 1 Tab By Mouth At Bedtime and Increase To 2 As Tolerated  Dr. Hortencia Pilar 7)  Ms Contin 60 Mg Xr12h-Tab (Morphine Sulfate) .Marland Kitchen.. 1 Tab  By Mouth Every 12 Hours  Allergies (verified): 1)  Compazine 2)  * Antihistamines  Social History: Packs/Day:  0.5  Physical Exam  General:  NAD Lungs:  Normal respiratory effort, chest expands symmetrically. Lungs are clear to auscultation, no crackles or wheezes. Heart:  Normal rate and regular rhythm. S1 and S2 normal without gallop, murmur, click, rub or other extra sounds.   Impression & Recommendations:  Problem # 1:  DEGENERATIVE DISC DISEASE, LUMBOSACRAL SPINE (ICD-722.52) On higher dose MS Contin--to take at regular times and intervals and use hydrocodone only for breakthrough pain  Problem # 2:  INSOMNIA (ICD-780.52) To work on sleep routine Go to bed earlier and get up at same time  Complete Medication List: 1)  Clonazepam 0.5 Mg Tabs (Clonazepam) .Marland Kitchen.. 1 tab by mouth three times a day dr. Hortencia Pilar 2)  Toprol Xl 50 Mg Tb24 (Metoprolol succinate) .Marland Kitchen.. 1 by mouth once daily 3)  Hydrocodone-acetaminophen 7.5-500 Mg Tabs (Hydrocodone-acetaminophen) .Marland Kitchen.. 1 tab by mouth every 6 hours as needed for breakthrough pain 4)  Fluoxetine Hcl 20 Mg Caps (Fluoxetine hcl) .... 2 caps by mouth daily dr. Hortencia Pilar 5)  Ibuprofen 800 Mg Tabs (Ibuprofen) .Marland Kitchen.. 1 tab by mouth two times a day with meals. 6)  Amitriptyline Hcl 50 Mg Tabs (Amitriptyline hcl) .... Take 1 tab by mouth  at bedtime and increase to 2 as tolerated  dr. Hortencia Pilar 7)  Ms Contin 60 Mg Xr12h-tab (Morphine sulfate) .Marland Kitchen.. 1 tab by mouth every 12 hours  Patient Instructions: 1)  Nurse visit for FLP in next week 2)  Follow up with Dr. Delrae Alfred in 4 months

## 2010-05-07 NOTE — Letter (Signed)
Summary: [facility]   Imported By: Arta Bruce 10/23/2009 11:31:25  _____________________________________________________________________  External Attachment:    Type:   Image     Comment:   External Document

## 2010-05-09 NOTE — Progress Notes (Signed)
Summary: Christie CONTIN & HYDROCODONE  Phone Note Call from Patient Call back at Home Phone [phone]   Reason for Call: Refill Medication Summary of Call: Christie Cervantes pt. Christie Cervantes STOPPED BY FOR A REFILL ON HER Christie CONTIN AND HYDROCODONE THATS DUE TOMORROW. Initial call taken by: Leodis Rains,  April 02, 2010 4:58 PM  Follow-up for Phone Call        Let her know we will be discussing cutting back on hydrocodone at her next visit. Follow-up by: Julieanne Manson MD,  April 02, 2010 6:29 PM  Additional Follow-up for Phone Call Additional follow up Details #1::        pt is aware.Marland KitchenMarland KitchenArmenia Cervantes  April 03, 2010 8:12 AM     Prescriptions: HYDROCODONE-ACETAMINOPHEN 7.5-500 MG TABS (HYDROCODONE-ACETAMINOPHEN) 1 tab by mouth every 6 hours as needed for breakthrough pain  #30 x 0   Entered and Authorized by:   Julieanne Manson MD   Signed by:   Julieanne Manson MD on 04/02/2010   Method used:   Print then Give to Patient   RxID:   [id number] Christie CONTIN 60 MG XR12H-TAB (MORPHINE SULFATE) 1 tab by mouth every 12 hours  #60 x 0   Entered and Authorized by:   Julieanne Manson MD   Signed by:   Julieanne Manson MD on 04/02/2010   Method used:   Print then Give to Patient   RxID:   [id number]

## 2010-05-09 NOTE — Progress Notes (Signed)
Summary: Pain meds due  Phone Note Call from Patient Call back at Home Phone [phone]   Summary of Call: Christie Cervantes pt., ms [place] calling for her ms contin and oxycodone. Initial call taken by: Leodis Rains,  May 02, 2010 12:28 PM    Prescriptions: MS CONTIN 60 MG XR12H-TAB (MORPHINE SULFATE) 1 tab by mouth every 12 hours  #60 x 0   Entered and Authorized by:   Julieanne Manson MD   Signed by:   Julieanne Manson MD on 05/03/2010   Method used:   Print then Give to Patient   RxID:   [id number] HYDROCODONE-ACETAMINOPHEN 7.5-500 MG TABS (HYDROCODONE-ACETAMINOPHEN) next  fill in January 2012 will be for 15 tabs only  1 tab by mouth every 6 hours as needed for breakthrough pain  #15 x 0   Entered and Authorized by:   Julieanne Manson MD   Signed by:   Julieanne Manson MD on 05/03/2010   Method used:   Print then Give to Patient   RxID:   [id number]

## 2010-05-09 NOTE — Assessment & Plan Note (Signed)
Summary: 4 MONTH F/U /TMM   Vital Signs:  Patient profile:   55 year old female Menstrual status:  hysterectomy Weight:      156.06 pounds BMI:     24.53 Temp:     98.4 degrees F oral Pulse rate:   72 / minute Pulse rhythm:   regular Resp:     18 per minute BP sitting:   140 / 98  (left arm) Cuff size:   regular  Vitals Entered By: Hale Drone CMA (April 12, 2010 2:19 PM) CC: 4 month f/u from 12/06/09. Having lower back bain. Especially when doing physical activity. Carpal Tunnel pain on right hand.  Is Patient Diabetic? No Pain Assessment Patient in pain? yes     Location: lower back Intensity: 5 Type: dull/aching Onset of pain  Constant  Does patient need assistance? Functional Status Self care Ambulation Normal   CC:  4 month f/u from 12/06/09. Having lower back bain. Especially when doing physical activity. Carpal Tunnel pain on right hand. .  History of Present Illness: 1.  Chronic back pain :  MS contin working  for her and she states she is taking 9 a.m. and 9 p.m.  Using Hydrocodone 1/2 tab at 1 p.m and 6 p.m.  --not really using on an as needed basis.  2.  Hypertension:  states not missing meds.  Did smoke a cigarette just before coming in.    3.  Hyperlipidemia:  Total and LDL much better and at goal, but trigs and HDL not.  Not a fish eater.  Current Medications (verified): 1)  Clonazepam 0.5 Mg  Tabs (Clonazepam) .Marland Kitchen.. 1 Tab By Mouth Three Times A Day Dr. Hortencia Pilar 2)  Toprol Xl 50 Mg Tb24 (Metoprolol Succinate) .Marland Kitchen.. 1 By Mouth Once Daily 3)  Hydrocodone-Acetaminophen 7.5-500 Mg Tabs (Hydrocodone-Acetaminophen) .Marland Kitchen.. 1 Tab By Mouth Every 6 Hours As Needed For Breakthrough Pain 4)  Fluoxetine Hcl 20 Mg Caps (Fluoxetine Hcl) .... 2 Caps By Mouth Daily Dr. Hortencia Pilar 5)  Ibuprofen 800 Mg Tabs (Ibuprofen) .Marland Kitchen.. 1 Tab By Mouth Two Times A Day With Meals. 6)  Amitriptyline Hcl 50 Mg Tabs (Amitriptyline Hcl) .... Take 1 Tab By Mouth At Bedtime and Increase To 2 As  Tolerated  Dr. Hortencia Pilar 7)  Ms Contin 60 Mg Xr12h-Tab (Morphine Sulfate) .Marland Kitchen.. 1 Tab By Mouth Every 12 Hours  Allergies (verified): 1)  Compazine 2)  * Antihistamines  Physical Exam  General:  NAD, moves easily Lungs:  Normal respiratory effort, chest expands symmetrically. Lungs are clear to auscultation, no crackles or wheezes. Heart:  Normal rate and regular rhythm. S1 and S2 normal without gallop, murmur, click, rub or other extra sounds.  Radial pulses normal and equal Extremities:  No edema Neurologic:  gait normal.     Impression & Recommendations:  Problem # 1:  HEALTH SCREENING (ICD-V70.0) Mammogram  Problem # 2:  DYSLIPIDEMIA (ICD-272.4) Improved. Pt. to start fish oil capsules 1000 mg daily and recheck FLP at next visit  Problem # 3:  HYPERTENSION (ICD-401.9) Increase Toprol to 75 mg daily Her updated medication list for this problem includes:    Toprol Xl 50 Mg Tb24 (Metoprolol succinate) .Marland Kitchen... 1 1/2 tabs by mouth once daily  Problem # 4:  DEGENERATIVE DISC DISEASE, LUMBOSACRAL SPINE (ICD-722.52) To wean to 15 tabs of hydrocodone monthly Stressed only for as needed use, not to be taken regularly if not needed Orders: T- * Misc. Laboratory test [phone]) urine drug screen  Complete Medication  List: 1)  Clonazepam 0.5 Mg Tabs (Clonazepam) .Marland Kitchen.. 1 tab by mouth three times a day dr. Hortencia Pilar 2)  Toprol Xl 50 Mg Tb24 (Metoprolol succinate) .Marland Kitchen.. 1 1/2 tabs by mouth once daily 3)  Hydrocodone-acetaminophen 7.5-500 Mg Tabs (Hydrocodone-acetaminophen) .... Next  fill in january 2012 will be for 15 tabs only  1 tab by mouth every 6 hours as needed for breakthrough pain 4)  Fluoxetine Hcl 20 Mg Caps (Fluoxetine hcl) .... 2 caps by mouth daily dr. Hortencia Pilar 5)  Ibuprofen 800 Mg Tabs (Ibuprofen) .Marland Kitchen.. 1 tab by mouth two times a day with meals. 6)  Amitriptyline Hcl 50 Mg Tabs (Amitriptyline hcl) .... Take 1 tab by mouth at bedtime and increase to 2 as tolerated  dr. Hortencia Pilar 7)   Ms Contin 60 Mg Xr12h-tab (Morphine sulfate) .Marland Kitchen.. 1 tab by mouth every 12 hours  Other Orders: Mammogram (Screening) (Mammo)  Patient Instructions: 1)  Fish oil capsules 1000 mg daily 2)  Follow up with Dr. Delrae Alfred in 4 months --CPP. 3)  Nurse visit for bp check in 2 weeks--increased Toprol to 75 mg Prescriptions: TOPROL XL 50 MG TB24 (METOPROLOL SUCCINATE) 1 1/2 tabs by mouth once daily  #45 x 11   Entered and Authorized by:   Julieanne Manson MD   Signed by:   Julieanne Manson MD on 04/12/2010   Method used:   Electronically to        [facility] [address] [phone]* (retail)       [address]       [place], [place]  [address]       Ph: [phone] or [phone]       Fax: [phone]   RxID:   [id number]    Orders Added: 1)  Mammogram (Screening) [Mammo] 2)  T- * Misc. Laboratory test [99999] 3)  Est. Patient Level IV [36644]   Not Administered:    Influenza Vaccine not given due to: declined

## 2010-05-09 NOTE — Assessment & Plan Note (Signed)
Summary: 1 WEEK FU FOR BP CHECK/KT  Nurse Visit   Vital Signs:  Patient profile:   55 year old female Menstrual status:  hysterectomy Weight:      152.9 pounds Temp:     98.6 degrees F oral Pulse rate:   72 / minute Pulse rhythm:   regular Resp:     20 per minute BP sitting:   164 / 108  (left arm) Cuff size:   regular  Vitals Entered By: Dutch Quint RN (May 03, 2010 10:52 AM) `CC: BP recheck Is Patient Diabetic? No Pain Assessment Patient in pain? yes     Location: back Intensity: 8 Type: sharp, aching Onset of pain  chronic, pain becomes sharp with movement  Does patient need assistance? Functional Status Self care Ambulation Normal   Serial Vital Signs/Assessments:  Time      Position  BP       Pulse  Resp  Temp     By 11:00 AM  R Arm     160/124                        Dutch Quint RN 11:50 AM  R Arm     144/110                        Dutch Quint RN 11:50 AM  L Arm     146/112                        Dutch Quint RN   CC:  BP recheck.  History of Present Illness: 04/26/10 BP 164/102.  Had cigarette 30 minutes before appt.  Had been taking Toprol 50 mg. two times a day, instead of the 75 mg. daily.  Had family stressors as well - father just died.  Appears anxious.  Took klonopin right after blood pressure was taken.   Patient Instructions: 1)  Reviewed with Dr. Delrae Alfred 2)  Your blood pressure has come down some, but not where it needs to be. 3)  Continue medications as ordered - no change at this time. 4)  Return in two weeks for blood pressure check with triage nurse. 5)  You declined the flu vaccination today. 6)  Call if anything changes or if you have any questions.   Impression & Recommendations:  Problem # 1:  HYPERTENSION (ICD-401.9) BP still elevated -- anxious, took clonazepam in office BP lower on recheck Continue meds - no change at this time Return in two weeks for BP recheck with triage nurse  Her updated medication list for this  problem includes:    Toprol Xl 100 Mg Xr24h-tab (Metoprolol succinate) ..... One tab by mouth daily for blood pressure  Orders: Est. Patient Level I (16109)  Complete Medication List: 1)  Clonazepam 0.5 Mg Tabs (Clonazepam) .Marland Kitchen.. 1 tab by mouth three times a day dr. Hortencia Pilar 2)  Toprol Xl 100 Mg Xr24h-tab (Metoprolol succinate) .... One tab by mouth daily for blood pressure 3)  Hydrocodone-acetaminophen 7.5-500 Mg Tabs (Hydrocodone-acetaminophen) .... Next  fill in january 2012 will be for 15 tabs only  1 tab by mouth every 6 hours as needed for breakthrough pain 4)  Fluoxetine Hcl 20 Mg Caps (Fluoxetine hcl) .... 2 caps by mouth daily dr. Hortencia Pilar 5)  Ibuprofen 800 Mg Tabs (Ibuprofen) .Marland Kitchen.. 1 tab by mouth two times a day with meals. 6)  Amitriptyline Hcl 50 Mg Tabs (  Amitriptyline hcl) .... Take 1 tab by mouth at bedtime and increase to 2 as tolerated  dr. Hortencia Pilar 7)  Ms Contin 60 Mg Xr12h-tab (Morphine sulfate) .Marland Kitchen.. 1 tab by mouth every 12 hours   Review of Systems CV:  Denies CP, SOB, visual changes, dizziness, peripheral edema.  States very slight headache, occurs rarely.Marland Kitchen   Physical Exam  General:  alert, well-developed, well-nourished, and well-hydrated.     Allergies: 1)  Compazine 2)  * Antihistamines  Not Administered:    Influenza Vaccine not given due to: declined  Orders Added: 1)  Est. Patient Level I [16109]

## 2010-05-09 NOTE — Assessment & Plan Note (Signed)
Summary: 2 WEEK FU FOR BP CHECK/KT  Nurse Visit   Vital Signs:  Patient profile:   55 year old female Menstrual status:  hysterectomy Weight:      156.9 pounds Temp:     97.1 degrees F oral Pulse rate:   64 / minute Pulse rhythm:   regular Resp:     16 per minute BP sitting:   164 / 102  (right arm) Cuff size:   regular  Vitals Entered By: Dutch Quint RN (April 26, 2010 10:48 AM)  Review of Systems CV:  Denies CP, headache, SOB, dizziness, visual changes..   Physical Exam  General:  alert, well-developed, well-nourished, and well-hydrated.     Patient Instructions: 1)  Reviewed with Dr. Delrae Alfred 2)  Your blood pressure is higher than your last visit. 3)  Take toprol 100 mg. once daily.  It's important that you take this every day. 4)  Return in one week for blood pressure recheck with triage nurse. 5)  Do not smoke before visit! 6)  Call if anything changes or if you have any questions.   Impression & Recommendations:  Problem # 1:  HYPERTENSION (ICD-401.9) BP higher than last visit  Started taking Toprol 50 mg. two times a day by choice Impacted by family stressors Toprol changed to 100 mg. daily Return in one week for BP recheck with triage nurse  Her updated medication list for this problem includes:    Toprol Xl 100 Mg Xr24h-tab (Metoprolol succinate) ..... One tab by mouth daily for blood pressure  Complete Medication List: 1)  Clonazepam 0.5 Mg Tabs (Clonazepam) .Marland Kitchen.. 1 tab by mouth three times a day dr. Hortencia Pilar 2)  Toprol Xl 100 Mg Xr24h-tab (Metoprolol succinate) .... One tab by mouth daily for blood pressure 3)  Hydrocodone-acetaminophen 7.5-500 Mg Tabs (Hydrocodone-acetaminophen) .... Next  fill in january 2012 will be for 15 tabs only  1 tab by mouth every 6 hours as needed for breakthrough pain 4)  Fluoxetine Hcl 20 Mg Caps (Fluoxetine hcl) .... 2 caps by mouth daily dr. Hortencia Pilar 5)  Ibuprofen 800 Mg Tabs (Ibuprofen) .Marland Kitchen.. 1 tab by mouth two times a  day with meals. 6)  Amitriptyline Hcl 50 Mg Tabs (Amitriptyline hcl) .... Take 1 tab by mouth at bedtime and increase to 2 as tolerated  dr. Hortencia Pilar 7)  Ms Contin 60 Mg Xr12h-tab (Morphine sulfate) .Marland Kitchen.. 1 tab by mouth every 12 hours  Other Orders: Capillary Blood Glucose/CBG (16109)  CC: BP recheck Is Patient Diabetic? No Pain Assessment Patient in pain? yes     Location: back Intensity: 7 Type: dull, aching, sharp Onset of pain  pain changes with position, chronic CBG Result 95 CBG Device ID B  Does patient need assistance? Functional Status Self care Ambulation Normal   Serial Vital Signs/Assessments:  Time      Position  BP       Pulse  Resp  Temp     By 10:52 AM  L Arm     136/96                         Dutch Quint RN 11:34 AM  R Arm     146/102                        Dutch Quint RN 11:34 AM  L Arm     162/100  Dutch Quint RN   CC:  BP recheck.  History of Present Illness: 04/12/10  BP 140/98.  Toprol increased to 75 mg. daily.  Woke up at 10 am, took Toprol 50 mg. at 10:20.    Taking meds every daily, no skipped doses.  Has been taking two tablets daily because she had trouble breaking in half, one tab in morning, one in evening.  Denies side effects.  Had cigarette about 30 minutes ago.  Is very anxious.  States no stressors except father who is expected to die at any time.  Just took second Toprol 50 mg. and a Klonopin after vital signs were done.  CBG done per pt. request due to familiy history.   Allergies: 1)  Compazine 2)  * Antihistamines Laboratory Results   Blood Tests     CBG Random:: 95mg /dL     Not Administered:    Influenza Vaccine not given due to: declined  Orders Added: 1)  Capillary Blood Glucose/CBG [82948] 2)  Est. Patient Level II [16109] Prescriptions: TOPROL XL 100 MG XR24H-TAB (METOPROLOL SUCCINATE) One tab by mouth daily for blood pressure  #30 x 3   Entered by:   Dutch Quint RN   Authorized by:    Julieanne Manson MD   Signed by:   Dutch Quint RN on 04/26/2010   Method used:   [facility]        [facility] [address] [phone]* (retail)       [address]       [place], [place]  [address]       Ph: [phone] or [phone]       Fax: [phone]   RxID:   [id number]

## 2010-05-20 ENCOUNTER — Encounter: Payer: Self-pay | Admitting: Nurse Practitioner

## 2010-05-20 ENCOUNTER — Encounter (INDEPENDENT_AMBULATORY_CARE_PROVIDER_SITE_OTHER): Payer: Self-pay | Admitting: Nurse Practitioner

## 2010-05-29 NOTE — Assessment & Plan Note (Signed)
Summary: BP recheck  Nurse Visit   Vital Signs:  Patient profile:   55 year old female Menstrual status:  hysterectomy Weight:      158.5 pounds Temp:     97.6 degrees F oral Pulse rate:   80 / minute Pulse rhythm:   regular Resp:     16 per minute BP sitting:   150 / 104  (right arm) Cuff size:   regular  Vitals Entered By: Dutch Quint RN (May 20, 2010 11:05 AM)  Patient Instructions: 1)  Reviewed with Jesse Fall 2)  Your blood pressure is still elevated. 3)  Start hydrochlorothiazide 25 mg. one tablet by mouth daily.  This has already been sent to your pharmacy.  Take in addition to the Toprol. 4)  Return in two weeks for a blood pressure recheck with triage nurse. 5)  Call if anything changes or if you have any questions.   Impression & Recommendations:  Problem # 1:  HYPERTENSION (ICD-401.9) BP still elevated, appears less anxious today HCTZ added Return in two weeks for BP recheck with triage nurse.  Her updated medication list for this problem includes:    Toprol Xl 100 Mg Xr24h-tab (Metoprolol succinate) ..... One tab by mouth daily for blood pressure    Hydrochlorothiazide 25 Mg Tabs (Hydrochlorothiazide) ..... One tablet daily for blood pressure  Complete Medication List: 1)  Clonazepam 0.5 Mg Tabs (Clonazepam) .Marland Kitchen.. 1 tab by mouth three times a day dr. Hortencia Pilar 2)  Toprol Xl 100 Mg Xr24h-tab (Metoprolol succinate) .... One tab by mouth daily for blood pressure 3)  Hydrocodone-acetaminophen 7.5-500 Mg Tabs (Hydrocodone-acetaminophen) .... Next  fill in january 2012 will be for 15 tabs only  1 tab by mouth every 6 hours as needed for breakthrough pain 4)  Fluoxetine Hcl 20 Mg Caps (Fluoxetine hcl) .... 2 caps by mouth daily dr. Hortencia Pilar 5)  Ibuprofen 800 Mg Tabs (Ibuprofen) .Marland Kitchen.. 1 tab by mouth two times a day with meals. 6)  Amitriptyline Hcl 50 Mg Tabs (Amitriptyline hcl) .... Take 1 tab by mouth at bedtime and increase to 2 as tolerated  dr. Hortencia Pilar 7)  Ms  Contin 60 Mg Xr12h-tab (Morphine sulfate) .Marland Kitchen.. 1 tab by mouth every 12 hours 8)  Hydrochlorothiazide 25 Mg Tabs (Hydrochlorothiazide) .... One tablet daily for blood pressure  Anticoagulation Management Assessment/Plan:             Review of Systems CV:  Complains of leg cramps with exertion; Denies CP, SOB, dizziness, headache, cough, visual changes,  peripheral edema..   CC:  BP recheck.  History of Present Illness: 05/03/10 - BP 164/108  P 72.  Was very anxious at last visit -- father had just died and she was in middle of funeral arrangements.  Had to take a klonopin in the office..  There was no change in meds at that time.  Has not taken pain meds this morning -- had cigarette 9:30 am.    Appears less anxious, but fatigued. States she's eating better, regained some weight lost during stress of father's illness and death.    Physical Exam  General:  alert, well-developed, well-nourished, and well-hydrated.    CC: BP recheck Is Patient Diabetic? No Pain Assessment Patient in pain? yes     Location: lower back Intensity: 8 Type: aching, sharp Onset of pain  Chronic, becomes sharp with movement  Does patient need assistance? Functional Status Self care Ambulation Normal   Allergies: 1)  Compazine 2)  * Antihistamines  Orders Added: 1)  Est. Patient Level II [09811] Prescriptions: HYDROCHLOROTHIAZIDE 25 MG TABS (HYDROCHLOROTHIAZIDE) One tablet daily for blood pressure  #30 x 1   Entered by:   Dutch Quint RN   Authorized by:   Lehman Prom FNP   Signed by:   Dutch Quint RN on 05/20/2010   Method used:   [facility]        [facility] [address] [phone]* (retail)       [address]       [place], [place]  [address]       Ph: [phone] or [phone]       Fax: [phone]   RxID:   [id number]

## 2010-05-30 ENCOUNTER — Encounter (INDEPENDENT_AMBULATORY_CARE_PROVIDER_SITE_OTHER): Payer: Self-pay | Admitting: Internal Medicine

## 2010-05-30 ENCOUNTER — Encounter: Payer: Self-pay | Admitting: Internal Medicine

## 2010-05-31 ENCOUNTER — Telehealth (INDEPENDENT_AMBULATORY_CARE_PROVIDER_SITE_OTHER): Payer: Self-pay | Admitting: Internal Medicine

## 2010-06-04 NOTE — Progress Notes (Signed)
Summary: MS Contin, hydrocodone  Phone Note Refill Request Message from:  Patient on May 31, 2010 11:13 AM  Refills Requested: Medication #1:  MS CONTIN 60 MG XR12H-TAB 1 tab by mouth every 12 hours  Medication #2:  HYDROCODONE-ACETAMINOPHEN 7.5-500 MG TABS next  fill in January 2012 will be for 15 tabs only  1 tab by mouth every 6 hours as needed for breakthrough pain pt would like to know if provider can refill med today with Monday's date so she don't have to go downtown   Method Requested: Pick up at [facility] Initial call taken by: [place] Shannon,  May 31, 2010 11:13 AM  Follow-up for Phone Call        Pt. notified that office has already closed and she will not be able to get before Monday.  Sent to Dr. Delrae Alfred.  Dutch Quint RN  May 31, 2010 4:16 PM     Prescriptions: MS CONTIN 60 MG XR12H-TAB (MORPHINE SULFATE) 1 tab by mouth every 12 hours  #60 x 0   Entered and Authorized by:   Julieanne Manson MD   Signed by:   Julieanne Manson MD on 05/31/2010   Method used:   Print then Give to Patient   RxID:   [id number] HYDROCODONE-ACETAMINOPHEN 7.5-500 MG TABS (HYDROCODONE-ACETAMINOPHEN) next  fill in January 2012 will be for 15 tabs only  1 tab by mouth every 6 hours as needed for breakthrough pain  #15 x 0   Entered and Authorized by:   Julieanne Manson MD   Signed by:   Julieanne Manson MD on 05/31/2010   Method used:   Print then Give to Patient   RxID:   [id number]

## 2010-06-04 NOTE — Assessment & Plan Note (Signed)
Summary: BP recheck  Nurse Visit   Vital Signs:  Patient profile:   55 year old female Menstrual status:  hysterectomy Weight:      152.9 pounds Temp:     98.9 degrees F oral Pulse rate:   64 / minute Pulse rhythm:   regular Resp:     20 per minute BP sitting:   128 / 94  (right arm) Cuff size:   regular  Vitals Entered By: Dutch Quint RN (May 30, 2010 11:00 AM)  Patient Instructions: 1)  Reviewed with Dr. Delrae Alfred 2)  Your blood pressure is better, but still elevated. 3)  Continue medications as ordered.  Don't miss doses.  Make sure you eat a banana every day. 4)  Return in one month for BP recheck with triage nurse. 5)  Work on limiting amount of caffeine to help reduce anxiety.  Use the stress-reduction exercises we discussed and practiced. 6)  You can use moist heat and over-the-counter medication like ibuprofen or alleve for the pain in your upper back.  Make sure you take them with food.  Let us know if it persists more than a couple of weeks. 7)  Call if anything changes or if you have any questions.   Impression & Recommendations:  Problem # 1:  HYPERTENSION (ICD-401.9) BP better, still elevated Has only taken two doses of HCTZ Continue taking medications as ordered Limit caffeine DIscussed and practiced stress-reduction techniques to reduce anxiety Return for BP recheck with triage in one month  Her updated medication list for this problem includes:    Toprol Xl 100 Mg Xr24h-tab (Metoprolol succinate) ..... One tab by mouth daily for blood pressure    Hydrochlorothiazide 25 Mg Tabs (Hydrochlorothiazide) ..... One tablet daily for blood pressure  Complete Medication List: 1)  Clonazepam 0.5 Mg Tabs (Clonazepam) .Marland Kitchen.. 1 tab by mouth three times a day dr. Hortencia Pilar 2)  Toprol Xl 100 Mg Xr24h-tab (Metoprolol succinate) .... One tab by mouth daily for blood pressure 3)  Hydrocodone-acetaminophen 7.5-500 Mg Tabs (Hydrocodone-acetaminophen) .... Next  fill in  january 2012 will be for 15 tabs only  1 tab by mouth every 6 hours as needed for breakthrough pain 4)  Fluoxetine Hcl 20 Mg Caps (Fluoxetine hcl) .... 2 caps by mouth daily dr. Hortencia Pilar 5)  Ibuprofen 800 Mg Tabs (Ibuprofen) .Marland Kitchen.. 1 tab by mouth two times a day with meals. 6)  Amitriptyline Hcl 50 Mg Tabs (Amitriptyline hcl) .... Take 1 tab by mouth at bedtime and increase to 2 as tolerated  dr. Hortencia Pilar 7)  Ms Contin 60 Mg Xr12h-tab (Morphine sulfate) .Marland Kitchen.. 1 tab by mouth every 12 hours 8)  Hydrochlorothiazide 25 Mg Tabs (Hydrochlorothiazide) .... One tablet daily for blood pressure  CC: BP recheck Is Patient Diabetic? No Pain Assessment Patient in pain? yes     Location: lower back Intensity: 9 Type: sharp, stabbing Onset of pain  upper back hurting x5 days, unsure if moved wrong, lower back hurting more  Does patient need assistance? Functional Status Self care Ambulation Normal   CC:  BP recheck.  History of Present Illness: 05/20/10 - BP 150/104  P 80.  Was started on HCTZ in addition to Toprol.  Just started taking HCTZ yesterday, didn't have money to get it.  Appears anxious.  Still smoking, had cigarette about an hour ago.  States she often doesn't want to get out of bed, is depressed.  Pain in upper left back occurred five days ago, increasing her stress because  of pain.   Physical Exam  General:  alert, well-developed, well-nourished, and well-hydrated.   Lungs:  normal respiratory effort, normal breath sounds, no crackles, and no wheezes.     Review of Systems CV:  Complains of fatigue; Denies CP, SOB, dizziness, peripheral edema, visual changes.  Says she's had a mild headache in the morning for 3 days, none today.. MS:  States upper left back pain, thinks she pulled a muscle.   Allergies: 1)  Compazine 2)  * Antihistamines  Orders Added: 1)  Est. Patient Level I [16109]

## 2010-06-05 ENCOUNTER — Encounter (INDEPENDENT_AMBULATORY_CARE_PROVIDER_SITE_OTHER): Payer: Self-pay | Admitting: Internal Medicine

## 2010-06-13 NOTE — Miscellaneous (Signed)
Summary: [facility] record review.  Clinical Lists Changes  Problems: Changed problem from DEPRESSION, SEVERE (ICD-311) - 2003 to DEPRESSION, MAJOR, RECURRENT, SEVERE (ICD-296.33) - 2003 Most recent meds 05/18/09 through [facility]:  Elavil 50 mg 2 at bedtime, Prozac 40 mg daily, Klonopin 0.5 mg three times a day

## 2010-06-13 NOTE — Letter (Signed)
Summary: RECEIVED RECORDS FROM THE [facility]  RECEIVED RECORDS FROM THE [facility]   Imported By: Arta Bruce 06/07/2010 12:52:44  _____________________________________________________________________  External Attachment:    Type:   Image     Comment:   External Document

## 2010-06-28 ENCOUNTER — Telehealth (INDEPENDENT_AMBULATORY_CARE_PROVIDER_SITE_OTHER): Payer: Self-pay | Admitting: Internal Medicine

## 2010-07-08 ENCOUNTER — Ambulatory Visit (HOSPITAL_COMMUNITY): Admission: RE | Admit: 2010-07-08 | Payer: Medicare Other | Source: Ambulatory Visit

## 2010-07-09 NOTE — Progress Notes (Signed)
Summary: MED REFILL  Phone Note Call from Patient Call back at Work Phone [phone]   Reason for Call: Refill Medication Summary of Call: PT WANTS REFILL ON MS CONTIN AND HYDROCODONE. Initial call taken by: Ayesha Rumpf,  June 28, 2010 2:31 PM    Prescriptions: MS CONTIN 60 MG XR12H-TAB (MORPHINE SULFATE) 1 tab by mouth every 12 hours  #60 x 0   Entered and Authorized by:   Julieanne Manson MD   Signed by:   Julieanne Manson MD on 07/02/2010   Method used:   Print then Give to Patient   RxID:   [id number] HYDROCODONE-ACETAMINOPHEN 7.5-500 MG TABS (HYDROCODONE-ACETAMINOPHEN) next  fill in January 2012 will be for 15 tabs only  1 tab by mouth every 6 hours as needed for breakthrough pain  #15 x 0   Entered and Authorized by:   Julieanne Manson MD   Signed by:   Julieanne Manson MD on 07/02/2010   Method used:   Print then Give to Patient   RxID:   [id number]

## 2010-07-10 ENCOUNTER — Ambulatory Visit (HOSPITAL_COMMUNITY): Payer: Medicare Other | Attending: Internal Medicine

## 2010-07-22 LAB — URINALYSIS, ROUTINE W REFLEX MICROSCOPIC
Bilirubin Urine: NEGATIVE
Glucose, UA: NEGATIVE mg/dL
Hgb urine dipstick: NEGATIVE
Ketones, ur: NEGATIVE mg/dL
Nitrite: NEGATIVE
Protein, ur: NEGATIVE mg/dL
Specific Gravity, Urine: 1.038 — ABNORMAL HIGH (ref 1.005–1.030)
Urobilinogen, UA: 0.2 mg/dL (ref 0.0–1.0)
pH: 5.5 (ref 5.0–8.0)

## 2010-07-22 IMAGING — MG MM DIGITAL SCREENING BILAT
2 series · 2 of 2 positions shown · non-contrast
Comparison: none

DG SCREEN MAMMOGRAM BILATERAL
Bilateral CC and MLO view(s) were taken.

DIGITAL SCREENING MAMMOGRAM WITH CAD:
The breast tissue is extremely dense.  No masses or malignant type calcifications are identified.  
Compared with prior studies.

[L CC]
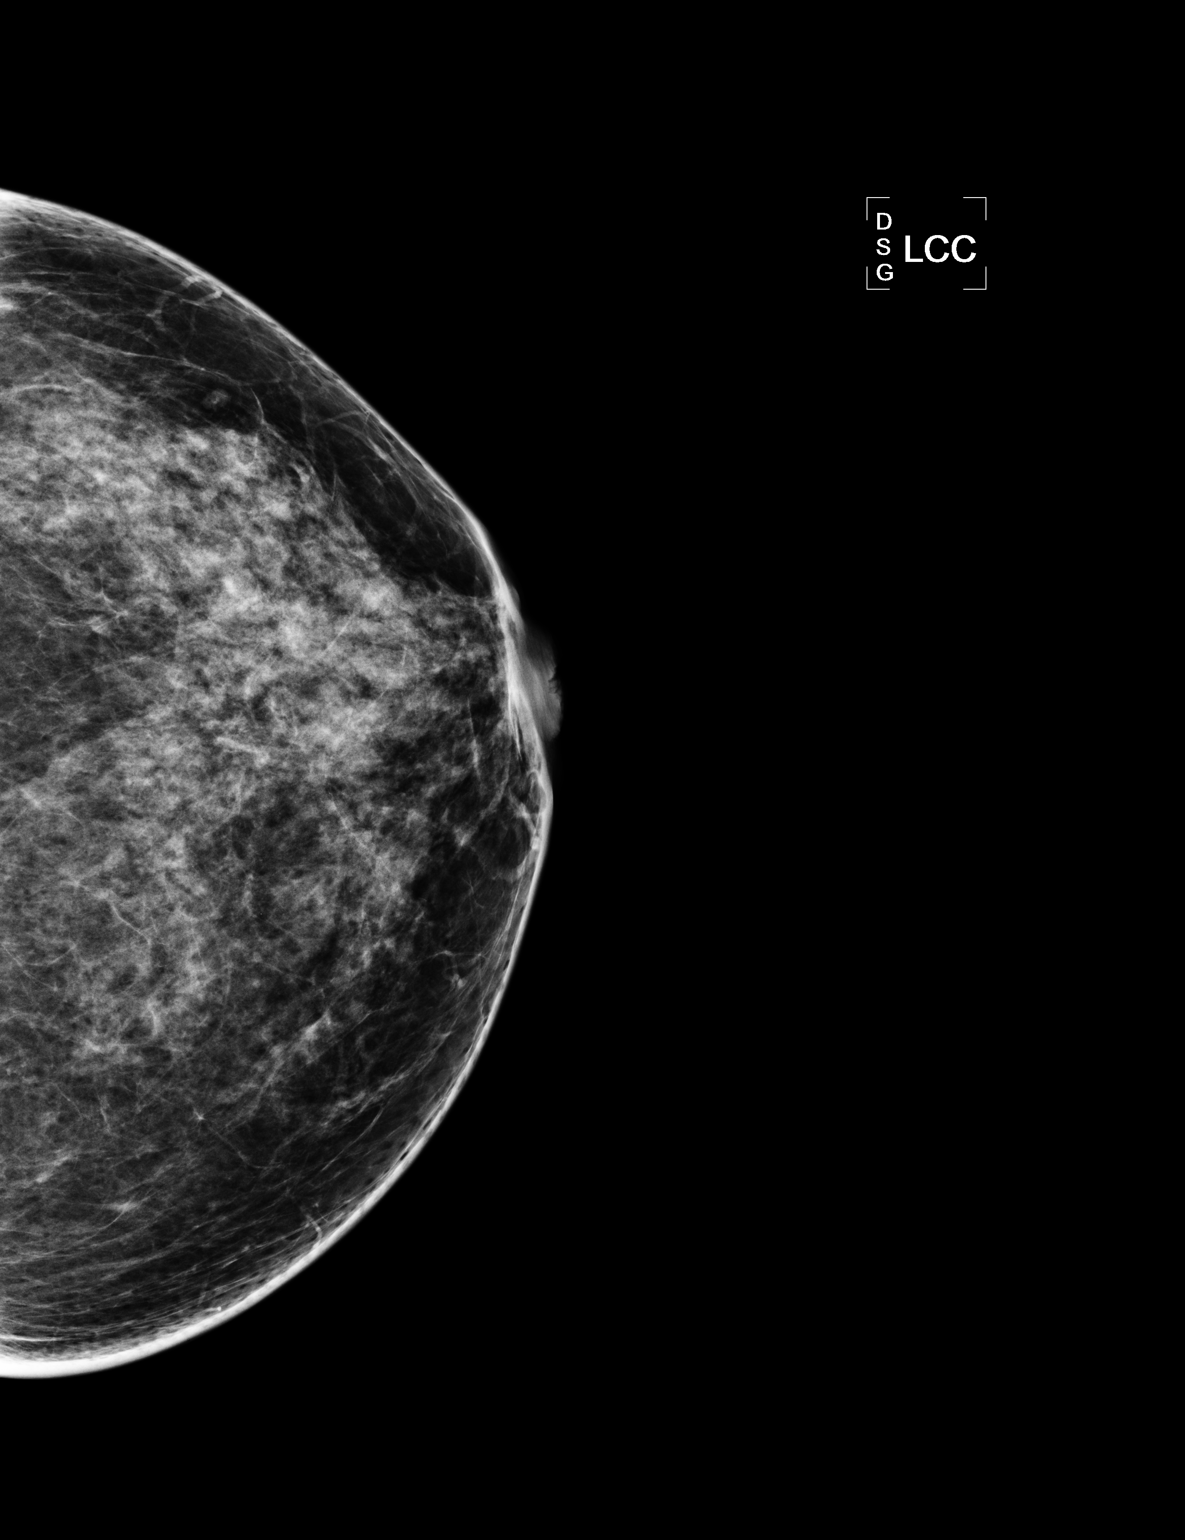

[L MLO]
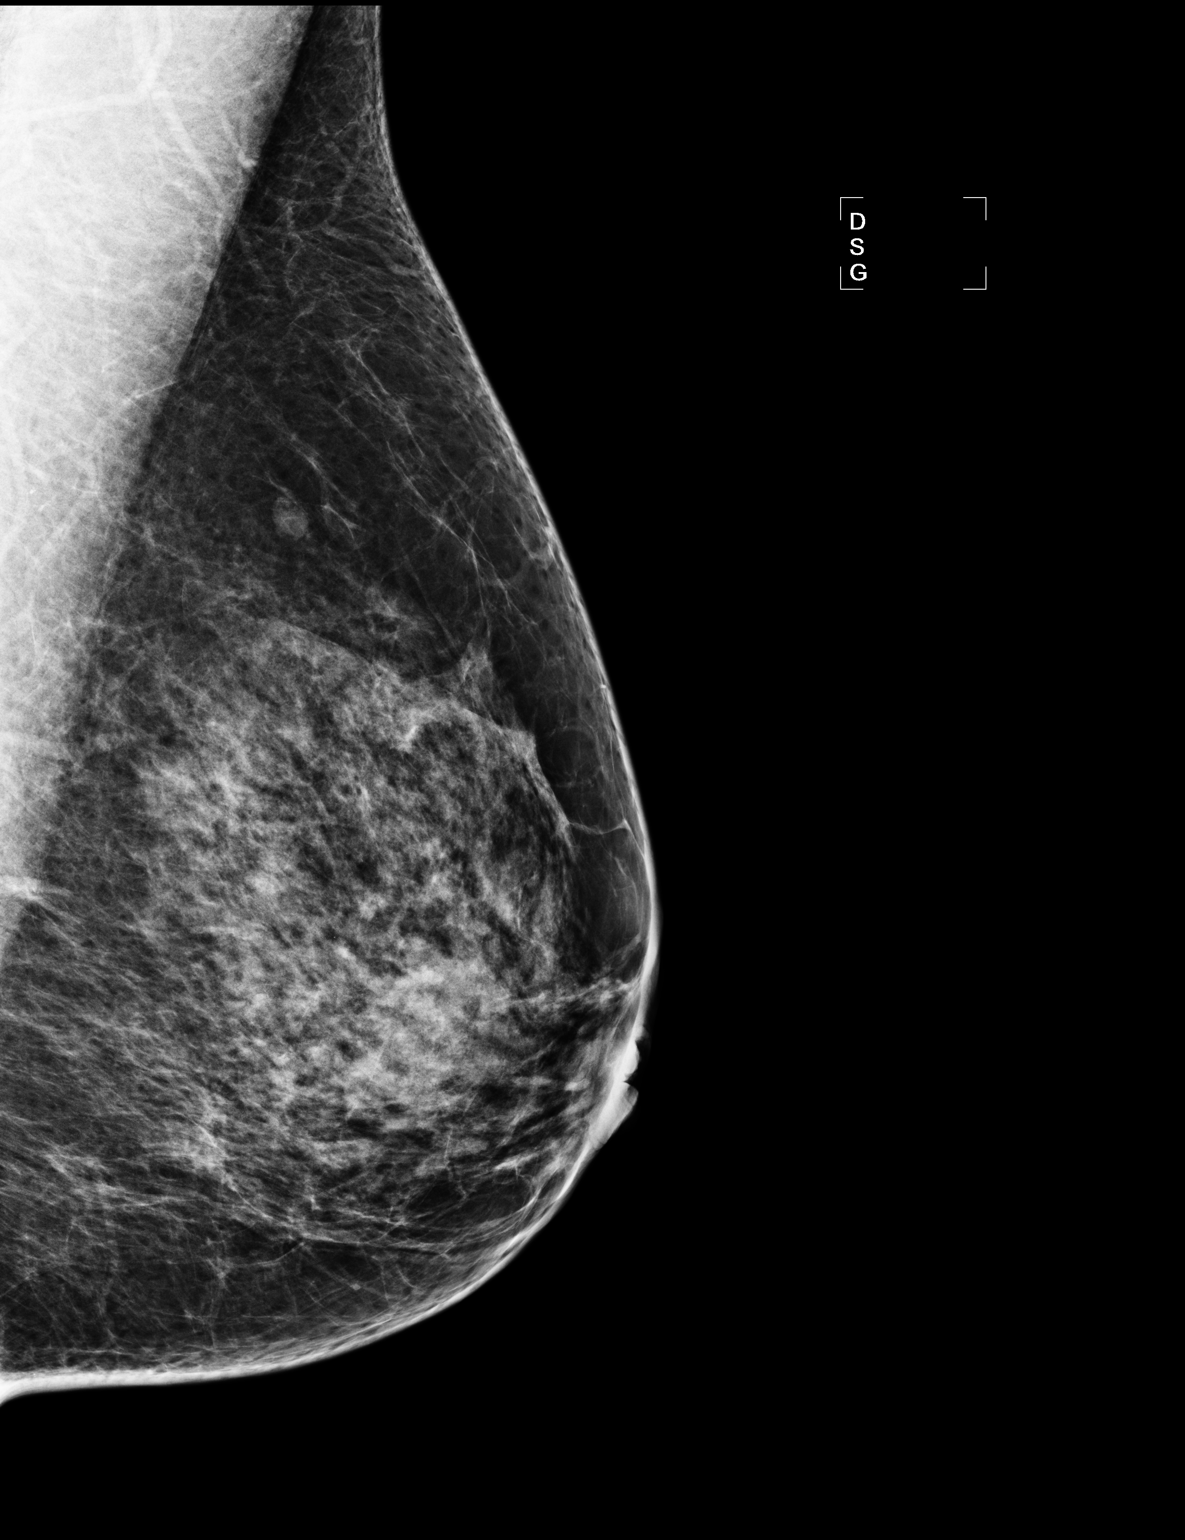

[2 of 2 positions shown; findings below may reference images not displayed]

IMPRESSION: No specific mammographic evidence of malignancy.  Next screening mammogram is recommended in one 
year.

ASSESSMENT: Negative - BI-RADS 1

Screening mammogram in 1 year.
ANALYZED BY COMPUTER AIDED DETECTION. , THIS PROCEDURE WAS A DIGITAL MAMMOGRAM.

## 2010-07-24 ENCOUNTER — Emergency Department (HOSPITAL_COMMUNITY)
Admission: EM | Admit: 2010-07-24 | Discharge: 2010-07-24 | Disposition: A | Discharge status: Home / Self Care | Payer: Medicare Other | Attending: Emergency Medicine | Admitting: Emergency Medicine

## 2010-07-24 ENCOUNTER — Emergency Department (HOSPITAL_COMMUNITY): Payer: Medicare Other

## 2010-07-24 DIAGNOSIS — I10 Essential (primary) hypertension: Secondary | ICD-10-CM | POA: Insufficient documentation

## 2010-07-24 DIAGNOSIS — M25539 Pain in unspecified wrist: Secondary | ICD-10-CM | POA: Insufficient documentation

## 2010-07-24 DIAGNOSIS — M129 Arthropathy, unspecified: Secondary | ICD-10-CM | POA: Insufficient documentation

## 2010-07-24 DIAGNOSIS — Z79899 Other long term (current) drug therapy: Secondary | ICD-10-CM | POA: Insufficient documentation

## 2010-07-30 ENCOUNTER — Ambulatory Visit (HOSPITAL_COMMUNITY)
Admission: RE | Admit: 2010-07-30 | Discharge: 2010-07-30 | Disposition: A | Discharge status: Home / Self Care | Payer: Medicare Other | Source: Ambulatory Visit | Attending: Internal Medicine | Admitting: Internal Medicine

## 2010-07-30 DIAGNOSIS — Z1231 Encounter for screening mammogram for malignant neoplasm of breast: Secondary | ICD-10-CM | POA: Insufficient documentation

## 2010-08-15 ENCOUNTER — Other Ambulatory Visit (HOSPITAL_COMMUNITY): Payer: Self-pay | Admitting: Internal Medicine

## 2010-08-15 DIAGNOSIS — N9489 Other specified conditions associated with female genital organs and menstrual cycle: Secondary | ICD-10-CM

## 2010-08-20 ENCOUNTER — Ambulatory Visit (HOSPITAL_COMMUNITY)
Admission: RE | Admit: 2010-08-20 | Discharge: 2010-08-20 | Disposition: A | Discharge status: Home / Self Care | Payer: Medicare Other | Source: Ambulatory Visit | Attending: Internal Medicine | Admitting: Internal Medicine

## 2010-08-20 ENCOUNTER — Other Ambulatory Visit (HOSPITAL_COMMUNITY): Payer: Self-pay | Admitting: Internal Medicine

## 2010-08-20 DIAGNOSIS — N9489 Other specified conditions associated with female genital organs and menstrual cycle: Secondary | ICD-10-CM

## 2010-08-20 DIAGNOSIS — R109 Unspecified abdominal pain: Secondary | ICD-10-CM | POA: Insufficient documentation

## 2010-08-23 NOTE — Discharge Summary (Signed)
[facility]  Patient:    Christie Cervantes, Christie Cervantes Visit Number: [id number] MRN: [id number]          Service Type: GYN Location: [id number] Attending Physician:  Antionette Char Dictated by:   Lucille Passy, M.D. Admit Date:  03/22/2001 Discharge Date: 03/26/2001                             Discharge Summary  DATE OF BIRTH:                Sep 03, 1955.  DISCHARGE DIAGNOSIS:          Total abdominal hysterectomy with lysis of                               adhesions for leiomyoma with secondary                               menometrorrhagia and chronic pelvic pain.  DISCHARGE MEDICATIONS:        1. Mepergan Forte p.o. q.6h. p.r.n. pain.                               2. Home medicines as usual which include                                  Elavil and Celexa.  DISCHARGE INSTRUCTIONS:       Activity as tolerated. Diet as tolerated. The patient was instructed to keep her wound clean and dry but that she is allowed to bathe. She was advised to contact her doctor if her incision becomes red, warm, has drainage, or has increased pain. No lifting greater than 20 pounds for six weeks. She will follow up at the GYN clinic at [facility] on Tuesday, March 30, 2001 at 1 p.m. to see Dr. Tamela Oddi, and she was instructed to call for this appointment.  BRIEF HOSPITAL COURSE:        [name] is a 55 year old with a history of fibroids and chronic pelvic pain. Please see dictated history and physical dictated on January 22, 2001 for complete history. On March 22, 2001 the patient underwent a total abdominal hysterectomy with lysis of adhesions by Dr. Tamela Oddi and Dr. Clearance Coots under general anesthesia which demonstrated a moderately enlarged myomatous uterus with secondary menometrorrhagia refractory to medical management and chronic pelvic pain. There were no complications with this procedure. Please see dictated operative report for further  details. The patient tolerated this procedure very well. Her hemoglobin level preoperatively was 11.6 and on postoperative day #1 fell to 10.1. The patient was treated with routine postoperative orders.  By postoperative day #1 she was tolerating a regular diet and ambulating well independently. She was changed from PCA pain control to oral pain control on postoperative day #1. Initially she had some pain control problems and was not adequately covered by Percocets, so her oral pain regimen was changed to Demerol 50 mg p.o. q.6h. p.r.n. as well as Phenergan 25 mg p.o. q.6h. p.r.n., which adequately controlled the patients pain. Her maximum temperature during hospitalization was 100.4 but her incision at no time showed any signs of infection. Her appetite was somewhat poor on postoperative day #3  and the patient did not eat lunch or dinner, and for this reason she was kept until the morning of postoperative day #4. At that time she was tolerating a regular diet, ambulating, urinating well, and passing flatus. At the time of discharge, her incision was clean, dry, and intact. Staples had been removed and Steri-Strips were in place. She will discharged home on Mepergan which consists of Demerol and Phenergan for pain control and will follow up with Dr. Tamela Oddi five days after discharge.  Of note, pathology of the surgical specimen revealed a benign cervix with squamous metaplasia, benign endometrial polyp, leiomyomata. Dictated by:   Lucille Passy, M.D. Attending Physician:  Antionette Char DD:  03/26/01 TD:  03/28/01 Job: [id number]

## 2010-08-23 NOTE — Assessment & Plan Note (Signed)
MEDICAL RECORD NUMBER:  [id number]   DATE OF BIRTH:  [birth year]   DATE OF VISIT:  November 12, 2004   INTERVAL HISTORY:  Christie Cervantes is back regarding her low back pain.  She has been  doing fairly well since last visit.  She uses ibuprofen on a p.r.n. basis  600 to 800 mg.  The hydrocodone 10/650 seems to help some of her  breakthrough pain still.  She has found the Elavil very helpful for sleep.  She is very optimistic that her disability will be going through.  She still  reports her pain as 8/10 and interferes with general activity, relations  with others and enjoyment of life on a moderate to severe level.  Sleep is  fair.  Pain worsens with walking, bending and particularly standing for  prolonged periods of time.  She reports some numbness in the hands.  She  does have a history of carpal tunnel syndrome.   REVIEW OF SYSTEMS:  Patient reports occasional confusion, decreased mood,  night sweats, and occasional coughing.  Full review of systems is in the  written health and history section of the chart.   SOCIAL HISTORY:  Patient is widowed and lives with her daughter.   PHYSICAL EXAMINATION:  VITAL SIGNS:  Blood pressure is 131/63, pulse 73,  respiratory rate 16, she is saturating 100% in room air.  GENERAL:  Patient is pleasant, no acute distress.  HEART:  Regular rate and rhythm.  LUNGS:  Clear.  ABDOMEN:  Soft and nontender.  NEUROLOGIC:  Patient alert and oriented x3.  Her affect is bright and  appropriate.  Gait is stable.  She stands with a hyperlordotic posture.  The  right hemipelvis is slightly elevated over the left.  She has pain with  palpation over the lumbar facet's and paraspinal's although no focal spasm  was noted today.  Facet maneuvers were positive on the left, equivocal on  the right.  Extension caused pain at approximately 10 degrees.  She was able  to flex to about 60 degrees with some discomfort today.  Lateral bending and  rotation caused less  discomfort today.  Motor exam was 5/5.  Sensory exam  was normal.  She had pain along the bilateral wrists with positive Tinel's  sign bilaterally.   ASSESSMENT:  1.  Chronic low back with significant facet disease at L4-L5 and L3-L4 left      greater than right.  Patient also has a history of herniated L5-S1 disk.  2.  Anxiety/depression.  3.  Insomnia.   PLAN:  1.  Continue Lorcet for breakthrough pain 10/650 one q.6h. p.r.n. (#90).  2.  Encouraged neutral splint to wear for the wrist.  She can get this over      the counter.  Also would begin scheduled ibuprofen 600 mg q.8h. for 3-4      weeks and then change to p.r.n.  3.  Continue Elavil 100 mg at bedtime for sleep.  I wrote her a new      prescription to cover the 100 mg today.  4.  Consider facet injections or therapy in the future.  5.  I will see the patient back in about 6 weeks' time.       ZTS/MedQ  D:  11/12/2004 14:53:50  T:  11/12/2004 22:54:50  Job #:  [id number]

## 2010-08-23 NOTE — Assessment & Plan Note (Signed)
MEDICAL RECORD NUMBER:  [id number]   DATE OF VISIT:  February 10, 2005.   HISTORY OF PRESENT ILLNESS:  Christie Cervantes is here on a followup of her low  back pain.  She is still having significant symptoms in the low back.  She  also complains of some increased numbness and tingling in the hands and  occasional numbness in the heels.  The symptoms seem to improve after she  gets up in the morning.  In general, the patient rates her pain as an 8 out  of 10 and describes it as sharp, dull, stabbing, often tingling and aching.  In interferes with her general activities, relationships with others, and  enjoyment of life on a moderate to severe level.  The pain is usually worse  in the morning and with activities.  The patient can walk about five minutes  without having to stop.  After she walks for awhile, she begins to have pain  in her low back without radiation.   CURRENT MEDICATIONS:  1.  Lorcet 10/650 mg one q.6h p.r.n.  2.  Elavil or Seroquel at night to help her sleep.  3.  Ibuprofen 600 mg q.8h p.r.n. for pain as well.   REVIEW OF SYSTEMS:  The patient reports weakness and numbness as mentioned  above, tingling, decreased mood and anxiety.  Denies any constitutional, GU,  GI, or cardiorespiratory complaints today.   SOCIAL HISTORY:  The patient is widowed and lives with her daughter.  She is  awaiting approval of her disability claim.   PHYSICAL EXAMINATION:  VITAL SIGNS:  Blood pressure is 123/76, pulse is 86,  respiratory rate is 16.  She is sating 100% on room air.  GENERAL:  The patient is pleasant and in no acute distress.  She is alert  and oriented x3.  Affect is bright and appropriate.  Gait is stable.  She is  wearing two-inch heels today.  HEART:  Regular rate and rhythm.  LUNGS:  Clear.  ABDOMEN:  Soft and nontender.  NEUROLOGICAL:  Motor function was decreased in the bilateral hands at 4 out  of 5.  She is 5 out of 5 proximally.  Sensory exam was grossly intact.  Tinel's  test was positive, Phalen's test was positive today.  No sensory  findings were noted in the feet.  Lower extremity strength was 5 out of 5.  Facet maneuvers were positive, and the patient had some movement with  bending towards the left.  Extension did not cause a lot of pain today.  She  had no pain with flexion but had pain with returning to a standing position.   ASSESSMENT:  1.  Chronic low back pain with significant facet disease at L4-L5 and L3-L4,      left greater than right.  The patient also has a history of an L5-S1      herniated disk.  2.  Anxiety/depression.  3.  Insomnia.  4.  Carpal tunnel syndrome (questionable peripheral neuropathy related to      diabetes).   PLAN:  1.  I asked the patient to obtain a fasting blood sugar using her sister's      Glucometer.  The patient agreed.  2.  Will set the patient up for nerve conduction studies of the upper      extremities to discern the extent of her carpal tunnel syndrome and find      out if there is a peripheral neuropathy component to her symptoms.  She  may need to follow up with a surgeon regarding a neurosurgical      intervention.  3.  Continue Lorcet for breakthrough pain, one q.6h p.r.n., #120.  This is      the 10/650 mg dose.  4.  The patient may continue using her Elavil or Seroquel at bedtime.  Once      she has coverage for her medications, we may be able to start her on      some more scheduled medications to cover her pain.  5.  Consider facet injections in the future.  6.  Will see the patient back in about six to eight weeks time pending the      scheduling of her nerve conduction studies.      Ranelle Oyster, M.D.  Electronically Signed     ZTS/MedQ  D:  02/10/2005 14:42:35  T:  02/10/2005 19:27:10  Job #:  [id number]

## 2010-08-23 NOTE — Op Note (Signed)
[facility]  Patient:    Christie Cervantes, Christie Cervantes                       MRN: [id number] Proc. Date: 09/21/00 Adm. Date:  [id number] Attending:  Antionette Char CC:         [facility]   Operative Report  PREOPERATIVE DIAGNOSIS:  Submucous pedunculated myoma.  POSTOPERATIVE DIAGNOSIS:  Multiple submucous myomas; rule out endometrial polyps versus polypoid type endometrium.  OPERATION:  Hysteroscopy, dilatation and curettage.  SURGEON:  Roseanna Rainbow, M.D.  ANESTHESIA:  Laryngeal mask airway.  ESTIMATED BLOOD LOSS:  Less than 50 cc.  FINDINGS:  Upon hysteroscopic examination, there were multiple submucous myomas.  There was one noted on the right uterine wall near the internal os that was pedunculated on a fairly broad stalk; this fibroid was probably 1 cm in diameter.  There was a larger one approximately 2-3 cm in diameter that was coming off the left uterine side wall near the fundal portion of the uterus, and again, this had a broad stalk.  There was another fairly large submucous myoma involving the left cornu aspect of the uterus.  It was very broad based and sessile.  DESCRIPTION OF PROCEDURE:  The patient was taken to the operating placed and placed in the dorsal lithotomy position and given laryngeal mask airway, and prepped and draped in the usual sterile fashion.  A weighted speculum was then placed in the patients vagina and a Sims retractor used anteriorly.  The cervix was grasped with a single-tooth tenaculum.  The cervix was then dilated with [facility] dilators and operative hysteroscope using sorbitol as the distending medium was then introduced into the cervical canal and then into the uterine cavity with the above findings noted.  As there were multiple submucous myomas this case was felt not to be amenable to any resectoscopic or resectoscopic approach for myomectomy.  A D&C was then performed, and  fairly moderate polypoid tissue was curetted.  The uterus sounded to 11 cm.  The tenaculum was then removed with good hemostasis noted.  The patient tolerated the procedure well.  The patient was taken to the PACU in good condition. Pathology:  Endometrial curettings. DD:  09/21/00 TD:  09/21/00 Job: [id number]

## 2010-08-23 NOTE — Op Note (Signed)
[facility]  Patient:    Christie Cervantes, Christie Cervantes Visit Number: [id number] MRN: [id number]          Service Type: GYN Location: [id number] Attending Physician:  Antionette Char Dictated by:   Roseanna Rainbow, M.D. Proc. 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  PROCEDURE:                    Total abdominal hysterectomy with lysis of adhesions.  SURGEON:                      Roseanna Rainbow, M.D., Bing Neighbors. Clearance Coots, M.D.  ANESTHESIA:                   General endotracheal.  COMPLICATIONS:                None.  ESTIMATED BLOOD LOSS:         200 cc.  FLUIDS:                       As per anesthesiology.  URINE OUTPUT:                 As per anesthesiology.  FINDINGS:                     Examination under anesthesia:  Diffusely enlarged uterus.  Operative findings:  Approximately 12 x 6 cm uterus.  The left adnexa was absent.  The right ovary was small, normal appearing. However, it was involved in adhesions to the fallopian tubes and sigmoid colon.  Of note, there were multiple adhesions involving the sigmoid to the pelvic side wall and to the posterior aspect of the uterus as well as omental adhesions involving the parietoperitoneum of the anterior abdominal wall.  PROCEDURE:                    The risks, benefits, and alternatives of the procedure were reviewed with the patient and informed consent was obtained. The patient was taken to the operating room with an IV running.  The patient was placed in a dorsal lithotomy position, given general anesthesia, and prepped and draped in  the usual sterile fashion.  A Pfannenstiel skin incision was then made through the previous scar and extended to the rectus fascia. The fascia was incised bilaterally with curved Mayo scissors and the muscles of the anterior abdominal wall were separated in the midline by sharp dissection.  The parietoperitoneum was grasped between two pickups, elevated, and entered sharply.  At this point the adhesions involving the sigmoid colon and the omentum were lysed using sharp dissection.  An Location manager was then placed into the incision and the bowel packed away with moistened laparotomy sponges.  Two long Kelly clamps were placed in the cornu and used for retraction.  The round ligaments on both sides were divided with the Bovie.  The anterior leaf of the broad ligament was incised along the bladder reflection to  the midline from both sides.  The bladder was sharply dissected off the lower uterine segment and cervix.  The utero-ovarian ligament and fallopian tube on the right were doubly clamped, transected, and suture ligated and both free ligatures and suture ligatures were placed with 0 Vicryl.  Hemostasis was visualized.  The uterine arteries were skeletonized bilaterally, clamped with parametrial clamps, transected, and suture ligated with 0 Vicryl.  Again, hemostasis was assured.  At this point the uterine fundus was amputated above the level of the uterine pedicle.  The uterosacral ligaments were clamped on both sides, transected, and suture ligated in a similar fashion.  The cervix was then amputated.  The vaginal cuff angles were secured with suture ligatures of 0 Vicryl.  The remainder of the vaginal cuff was closed with a series of interrupted 0 Vicryl figure-of-eight sutures. Hemostasis was assured.  The pelvis was copiously irrigated with warm normal saline.  All laparotomy sponges and instruments were removed from the abdomen. The fascia was reapproximated with 0 PDS  and hemostasis was assured.  The previous scar was then revised and the scarred subcutaneous layer was released with the Bovie.  Skin was then closed with staples.  Sponge, lap, needle, and instrument counts were correct x 2.  The patient was taken to the PACU awake and in stable condition. Dictated by:   Roseanna Rainbow, M.D. Attending Physician:  Antionette Char DD:  03/22/01 TD:  03/23/01 Job: [id number]

## 2010-08-23 NOTE — Discharge Summary (Signed)
[facility]  Patient:    Christie Cervantes, Christie Cervantes Visit Number: [id number] MRN: [id number]          Service Type: MED Location: 910A 9105 [id number] Attending Physician:  Tammi Sou Dictated by:   Salvadore Dom, M.D. Admit Date:  03/29/2001 Discharge Date: 04/03/2001                             Discharge Summary  DATE OF BIRTH:                25-Aug-1955  REASON FOR ADMISSION:         Nausea, vomiting, diarrhea.  HISTORY OF PRESENT ILLNESS:   This 55 year old presented with acute onset of nausea, with two episodes of vomiting, followed by several episodes of diarrhea.  She was unsure of an increased temperature, had taken some Motrin early in the night.  She is postoperative day #7 at this presentation, for RTH for multiple fibroids.  She also had an LSO.  She complained of general body aches and feels "awful."  MEDICATIONS:                  Ultracet and Motrin.  ALLERGIES:                    COMPAZINE, TALWIN and PERCODAN.  OBSTETRICAL HISTORY:          None.  GYNECOLOGICAL HISTORY:        1. Hysterectomy secondary to fibroids.                               2. History of myomectomy x2.  MEDICAL HISTORY:              Anemia.  SURGICAL HISTORY:             Hysterectomy and appendectomy.  _________ in 1999 and recently in 2002.  SOCIAL HISTORY:               Smokes tobacco, 1/2-pack per day.  Denies illicit drug use.  PHYSICAL EXAMINATION:         Stable.  GENERAL APPEARANCE:           She had general malaise, moaning and holding abdomen.  HEENT:                        Normal.  LUNGS:                        Clear.  NECK:                         Mobile.  HEART:                        Regular rate and rhythm, without rubs, murmurs or gallops.  BACK:                         She had no CVA tenderness.  ABDOMEN:                      Positive bowel sounds.  Soft, nontender.  Wound edges well-approximated.  Clean and dry.   Steri-strips in place.  RECTAL:  Deferred.  SKIN:                         Warm and dry.  EXTREMITIES:                  There was no edema.  LABORATORY/ACCESSORY DATA:    Initial labs showed a white count of 7.7, platelets 411,000, glucose of 138.  Normal LFTs.  All other labs done were normal.  HOSPITAL COURSE:              The patient was admitted for observation and IV fluids, antiemetics and pain medicine.  On hospital day #1, given the patients history, her primary physician, Dr. Tamela Oddi requested acute abdominal series that showed a focal ileus, consistent most with a partial small bowel obstruction.  Repeat acute abdominal series was consistent with early small bowel obstruction.  The patient then had NG tube placed and low wall suction.  During her hospitalization, the patient was extremely agitated, requesting large doses of Phenergan and Demerol, specifically Demerol, as well as anxiolytic medicines.  She, by history, had not been on anxiolytic medicines prior to this hospitalization.  She pulled her NG tube out and it had to be replaced.  She was walking the halls, screaming, also was extremely agitated and noted to be picking and having almost like tactile hallucinations.  The patient denied any alcohol use, but was placed on Valium q.i.d. for prophylaxis, as well as anxiolytic effect.  She did well after receiving this medication.  The NG tube was eventually discontinued.  On hospital day #4, she was started on a clear liquid diet.  On day #5, she advanced to a mechanical soft and then to a regular diet.  She tolerated these well, without any problems.  At discharge, she had good bowel sounds, nontender, passing flatus, no nausea or vomiting.  The patient, again, did request Demerol and Phenergan, specifically, to take home for her pain.  When asked earlier, she had denied any nausea, but was still specifically requesting the  Phenergan.  DISCHARGE DIET:               Regular with large doses of water recommended.  DISCHARGE ACTIVITY:           Recommended increased ambulation, otherwise, ad lib.  DISCHARGE MEDICATIONS:        1. Demerol 50 mg q.6h. p.r.n.  She was given                                  10 tablets only.                               2. Phenergan 12.5 mg p.r.n.  She was given                                  15 tablets only.  DISCHARGE CONDITION:          Improved.  DISCHARGE DIAGNOSES:          1. Status post hysterectomy and left salpingo-                                  oophorectomy.  2. Partial small bowel obstruction.                               3. Anxiety and agitation.                               4. Possible drug-seeking behavior.  This will                                  need to be further evaluated on an                                  outpatient basis. Dictated by:   Salvadore Dom, M.D. Attending Physician:  Tammi Sou DD:  04/03/01 TD:  04/03/01 Job: [id number]

## 2010-08-23 NOTE — H&P (Signed)
[facility]  Patient:    Christie Cervantes, Christie Cervantes Visit Number: [id number] MRN: [id number]          Service Type: Attending:  Roseanna Rainbow, M.D. Dictated by:   Roseanna Rainbow, M.D.                           History and Physical  CHIEF COMPLAINT:              The patient is a 55 year old, para 0, with a small myomatous uterus who presents for diagnostic laparoscopy, possible vaginal hysterectomy versus an abdominal approach.  HISTORY OF PRESENT ILLNESS:   The patient has a long history of myomatous uterus. She is status post a myomectomy and LSO in 1996. She was also found to have some tubal disease consistent with likely history of pelvic inflammatory disease. Her last Pap smear in December of 2001 was within normal limits. In June of 2002 she underwent a hysteroscopy. The planned procedure at that point was a possible operative hysteroscopy for resection of the submucous myomas. However, there were multiple submucous myomas and it was felt that the multiple myomas were not amenable to such an approach. The endometrial curettings from that procedure demonstrated an endometrial polyp without any evidence of any hyperplasia or malignancy. The symptoms from the fibroids are primarily menometrorrhagia with secondary mild anemia. A hemoglobin was 11.1 on January 19, 2001.  ALLERGIES:                    COMPAZINE.  MEDICATIONS:                  Celexa, Elavil, Vicodin.  PAST OB/GYN HISTORY:          As above.  PAST MEDICAL HISTORY:         Remarkable for COPD, depression, and menstrual migraines.  SOCIAL HISTORY:               Remarkable for one pack per day tobacco use.  PHYSICAL EXAMINATION:  VITAL SIGNS:                  Pulse 79, blood pressure 116/83, weight 159 pounds.  GENERAL:                      Well-developed, well-nourished, no apparent distress.  HEENT:                        Normocephalic, atraumatic.  NECK:                          Supple, no thyromegaly.  LUNGS:                        Clear to auscultation bilaterally.  HEART:                        Regular rate and rhythm.  ABDOMEN:                      Soft, nontender, no organomegaly. On pelvic exam, the external female genitalia are normal appearing. On bimanual exam the uterus is anterior, irregular contour with a right sided fibroid measuring approximately 3 cm in diameter. The adnexa were nonpalpable. The aggregate size of the womb was approximately 10 to 12 weeks size.  EXTREMITIES:                  No clubbing, cyanosis, or edema.  ASSESSMENT:                   Small myomatous uterus with secondary menorrhagia and anemia, refractory to medical management.  PLAN:                         Laparoscopy with possible total vaginal hysterectomy versus total abdominal hysterectomy. Dictated by:   Roseanna Rainbow, M.D. Attending:  Roseanna Rainbow, M.D. DD:  01/22/01 TD:  01/23/01 Job: [id number]

## 2010-08-23 NOTE — Group Therapy Note (Signed)
This patient is self referred.   CHIEF COMPLAINT:  Low back pain and some numbness in the hands.   HISTORY OF PRESENT ILLNESS:  This is a 55 year old African American female  with a long history of low back pain, followed by pain clinic for a year up  till December 2005, when she was discharged after taking a friend's  chronically by Dr. Doristine Section at [facility].  She has had some  in the office facet injections.  Apparently, she had a lumbar epidural as  well.  There is documentation of an MRI which revealed diffuse disk bulge at  L5-S1 with left paracentral disk protrusion causing mass effect on the left  S1 nerve root.  This MRI was dated Aug 13, 1999.  She had a followup MRI  performed, on Aug 10, 2002, which revealed degenerative disk protrusion.  There were facet degenerative changes as well, left greater than right.  Otherwise no significant abnormalities were noted.  The patient states that  she had no relief with injections.  She had been maintained somewhat on  Lorcet q.i.d. until she was discharged from our pain clinic with good  results.  She has a recent nerve conduction study which revealed moderate  carpal tunnel syndrome.  The patient states that she has had therapy remotely which seemed to help  her to a certain extent.  She ran out of insurance and was planning to go  back to therapy pending approval of her disability claim.  Now is an 8 to 9  out of 10 most of which is in her low back.  The pain is described as sharp,  dull, stabbing, and tingling at times.  The tingling is in her fingertips.  The pain interferes with general activity, relations with others, and  enjoyment of life on a moderate to severe level.  The pain usually worsens  with walking or standing for prolonged periods of time.  She hurts somewhat  with standing, medications.  She sleeps on her side.  She can not sleep on  her stomach due to increased pain.  Lying down and changing positions in  bed  tends to help, and she tends to sleep with her hips flexed.  The patient is  able to walk about 5-6 minutes without having to stop.  She is able to drive  short distances on her own.  She last worked in January 2005.   CURRENT MEDICATIONS:  1.  Lorcet 1-2 every day p.r.n., dose 10/650.  2.  Prozac 20 mg every day.  3.  Seroquel 50-100 mg q.h.s. (She had been on Elavil previously which she      states worked positive for the above plus depression/anxiety).  4.  Insomnia.   ALLERGIES:  None.   REVIEW OF SYSTEMS:  Positive for numbness in the fingertips,  depression/anxiety, weakness occasionally in the right arm associated with  pain.  She denies any constitutional, GU, GI, cardiorespiratory complaints  today.  A full review of systems is in the health and history section of the  chart.   SOCIAL HISTORY:  The patient is widowed, lives with a daughter and  granddaughter.   FAMILY HISTORY:  Positive for diabetes, hypertension, multiple myeloma.   PHYSICAL EXAMINATION:  VITAL SIGNS:  Blood pressure is 163/85, pulse is 76,  respiratory rate is 16, she is sating 99% on room air.  GENERAL:  The patient is pleasant, no acute distress.  HEART:  Regular rate and rhythm.  LUNGS:  Clear.  ABDOMEN:  Soft nontender.  NEUROLOGIC:  The patient is alert and oriented x 3.  Affect is bright and  appropriate.  Gait is slightly wide based and favoring the left side.  Coordination is fair.  Reflexes are 2+.  Sensation is decreased along the  median aspect of the hands bilaterally, left greater than right.  The  patient has positive Tinel sign on the left and right side today.  The  patient's cranial nerves were normal.  Sensation is noted as above as well  as her muscle exam.  Reflexes were 2+.  The patient had good coordination  today.  On examination of PSIS minimally tender.  The patient did have pain  over the L4-L5 facet on the left side as well as to a lesser extent the L3-  L4 facet.   Right lumbar facets are nontender today.  Facet provocative  testing was positive on the left today, and equivocal on the right.  Extension in general caused increased pain today.  Flexion caused some  discomfort but to a much lesser extent.  She was able to flex approximately  40 degrees, extend approximately 10 degrees today.  The patient's posture is  significant for an anterior pelvic tilt as well as anterior deviation of the  lumbar spine with hyperlordosis noted.  MUSCULOSKELETAL:  The patient had on the left and right side in the upper  extremities.  Lower extremity strength was generally 4+ to 5 out of 5 except  for pain inhibition due to back pain.  The patient had good passive and  active range of motion of both legs today.  The patient had a negative  Patrick test, straight leg raise test, piriformis test, Spurling test today.   ASSESSMENT:  1.  Chronic low back pain most likely associated with significant facet      disease at L4-L5 and to a lesser extent L3-L4.  The patient has a      history of herniated L5-S1 disk as well.  2.  Anxiety/depression.  3.  Insomnia.   PLAN:  1.  We will cover the patient with Lorcet for breakthrough pain, 10/650, one      q.6h. p.r.n. for now.  2.  Once she settles her disability claim, which she hopes will happen in      the next few weeks, we will begin some physical therapy and consider      facet injections.  3.  We will change her from Seroquel to Elavil to help with insomnia.  This      also will assist her with her pain a bit more.  We will write for 50 mg      of Elavil q.h.s. may repeat x 1.  4.  We will see the patient back in about six weeks' time.       ZTS/MedQ  D:  10/01/2004 16:40:23  T:  10/02/2004 01:52:28  Job #:  [id number]

## 2010-08-23 NOTE — Assessment & Plan Note (Signed)
MEDICAL RECORD NUMBER:  [id number]   Christie Cervantes is back regarding her low back pain. There really have not been a lot  of changes since I last saw her in November. She is still constrained from a  financial standpoint although she tells me she will have insurance hopefully  in the next 2-3 months. She rates her pain at an 8/10. She uses hydrocodone  10/650 one q.6h. p.r.n. for pain and she throws in a Tylenol 400 mg once or  twice a day with fair results. She describes her pain as constant, stabbing,  sharp. It is most prominent in the low back. She has some tingling in her  hands. Her pain interferes with general activity, relations with others, and  enjoyment of life on a severe level. Pain increases with walking, bending,  and sitting for prolonged periods of time. She still uses Elavil or Seroquel  at night for sleep. Apparently, she was tested for diabetes and her testing  was negative.   REVIEW OF SYSTEMS:  The patient reports some depression, anxiety, trouble  walking, tingling, numbness. She denies any constitutional, GU, GI, or  cardiorespiratory complaints today.   SOCIAL HISTORY:  The patient is widowed and lives with her daughter.   PHYSICAL EXAMINATION:  Blood pressure is 127/67, pulse is 89, respiratory  rate 16, she is saturating 99% in room air. The patient is pleasant, in no  acute distress. She is alert and oriented x3. Affect is bright and  appropriate. Gait stable. She continuous to wear high-heel shoes today.  Heart is respiratory rate, lungs are clear, abdomen soft and nontender.  Motor function was 5/5 except for the hand intrinsics, which are 4/5. She  had some tingling in the hands again today. Sensory exam otherwise is  grossly intact. Tinel's test was positive at the wrist was well as Phalen's  test. Facet maneuvers were positive, left greater than right. Extension  caused some pain. Flexion was poor today.   ASSESSMENT:  1.  Chronic low back pain with facet  disease at L4-L5 and L3-L4, left      greater than right. The patient also with a history of L5-S1 herniated      disc.  2.  Anxiety/depression.  3.  Insomnia.  4.  Likely carpal tunnel syndrome.   PLAN:  1.  Continue with Lorcet 10/650 one q.6h. p.r.n. I gave the patient Naprelan      tablets to use for pain as well as needed daily.  2.  I would really like to set the patient up for nerve conduction testing      as well as facet blocks. We need to await her disability approval first      for these. As it stands now, I will see her back in 3 months' time. We      discussed some stretching, core muscle strengthening, and aerobic      exercises that she should pursue today.      Ranelle Oyster, M.D.  Electronically Signed     ZTS/MedQ  D:  06/11/2005 10:51:31  T:  06/11/2005 16:51:51  Job #:  [id number]

## 2010-08-29 ENCOUNTER — Encounter: Payer: Self-pay | Admitting: *Deleted

## 2010-09-12 ENCOUNTER — Other Ambulatory Visit: Payer: Medicare Other | Admitting: Internal Medicine

## 2010-10-11 ENCOUNTER — Telehealth: Payer: Self-pay | Admitting: *Deleted

## 2010-10-11 NOTE — Telephone Encounter (Signed)
No ID on cell phone.  Sent NOS letter

## 2010-10-21 ENCOUNTER — Emergency Department (HOSPITAL_COMMUNITY)
Admission: EM | Admit: 2010-10-21 | Discharge: 2010-10-22 | Disposition: A | Discharge status: Home / Self Care | Payer: Medicare Other | Attending: Emergency Medicine | Admitting: Emergency Medicine

## 2010-10-21 DIAGNOSIS — F411 Generalized anxiety disorder: Secondary | ICD-10-CM | POA: Insufficient documentation

## 2010-10-21 DIAGNOSIS — Z79899 Other long term (current) drug therapy: Secondary | ICD-10-CM | POA: Insufficient documentation

## 2010-10-21 DIAGNOSIS — IMO0002 Reserved for concepts with insufficient information to code with codable children: Secondary | ICD-10-CM | POA: Insufficient documentation

## 2010-10-21 DIAGNOSIS — M129 Arthropathy, unspecified: Secondary | ICD-10-CM | POA: Insufficient documentation

## 2010-10-21 DIAGNOSIS — I10 Essential (primary) hypertension: Secondary | ICD-10-CM | POA: Insufficient documentation

## 2010-10-22 ENCOUNTER — Other Ambulatory Visit: Payer: Medicare Other | Admitting: Internal Medicine

## 2010-12-14 ENCOUNTER — Emergency Department (HOSPITAL_COMMUNITY)
Admission: EM | Admit: 2010-12-14 | Discharge: 2010-12-14 | Disposition: A | Discharge status: Home / Self Care | Payer: Medicare Other | Attending: Emergency Medicine | Admitting: Emergency Medicine

## 2010-12-14 DIAGNOSIS — I10 Essential (primary) hypertension: Secondary | ICD-10-CM | POA: Insufficient documentation

## 2010-12-14 DIAGNOSIS — Z79899 Other long term (current) drug therapy: Secondary | ICD-10-CM | POA: Insufficient documentation

## 2010-12-14 DIAGNOSIS — H53149 Visual discomfort, unspecified: Secondary | ICD-10-CM | POA: Insufficient documentation

## 2010-12-14 DIAGNOSIS — G43909 Migraine, unspecified, not intractable, without status migrainosus: Secondary | ICD-10-CM | POA: Insufficient documentation

## 2010-12-14 DIAGNOSIS — R110 Nausea: Secondary | ICD-10-CM | POA: Insufficient documentation

## 2011-04-08 HISTORY — PX: COLONOSCOPY: SHX174

## 2011-04-15 ENCOUNTER — Encounter: Payer: Self-pay | Admitting: Emergency Medicine

## 2011-04-15 ENCOUNTER — Emergency Department (HOSPITAL_COMMUNITY)
Admission: EM | Admit: 2011-04-15 | Discharge: 2011-04-16 | Disposition: A | Discharge status: Home / Self Care | Payer: Medicare Other | Attending: Emergency Medicine | Admitting: Emergency Medicine

## 2011-04-15 DIAGNOSIS — Z79899 Other long term (current) drug therapy: Secondary | ICD-10-CM | POA: Insufficient documentation

## 2011-04-15 DIAGNOSIS — F17200 Nicotine dependence, unspecified, uncomplicated: Secondary | ICD-10-CM | POA: Insufficient documentation

## 2011-04-15 DIAGNOSIS — R51 Headache: Secondary | ICD-10-CM

## 2011-04-15 DIAGNOSIS — H53149 Visual discomfort, unspecified: Secondary | ICD-10-CM | POA: Insufficient documentation

## 2011-04-15 DIAGNOSIS — R110 Nausea: Secondary | ICD-10-CM | POA: Insufficient documentation

## 2011-04-15 HISTORY — DX: Migraine, unspecified, not intractable, without status migrainosus: G43.909

## 2011-04-15 HISTORY — DX: Essential (primary) hypertension: I10

## 2011-04-15 NOTE — ED Notes (Signed)
PT. REPORTS MIGRAINE HEADACHE SINCE LAST NIGHT WITH NAUSEA.

## 2011-04-16 MED ORDER — KETOROLAC TROMETHAMINE 60 MG/2ML IM SOLN
60.0000 mg | Freq: Once | INTRAMUSCULAR | Status: AC
  Administered 2011-04-16: 60 mg via INTRAMUSCULAR
  Filled 2011-04-16: qty 2

## 2011-04-16 MED ORDER — ONDANSETRON HCL 8 MG PO TABS
8.0000 mg | ORAL_TABLET | Freq: Once | ORAL | Status: AC
  Administered 2011-04-16: 8 mg via ORAL
  Filled 2011-04-16: qty 1

## 2011-04-16 MED ORDER — HYDROMORPHONE HCL PF 2 MG/ML IJ SOLN
2.0000 mg | Freq: Once | INTRAMUSCULAR | Status: AC
  Administered 2011-04-16: 2 mg via INTRAMUSCULAR
  Filled 2011-04-16: qty 1

## 2011-04-16 MED ORDER — NAPROXEN 500 MG PO TABS: 500.0000 mg | ORAL_TABLET | Freq: Two times a day (BID) | ORAL | Status: DC

## 2011-04-16 NOTE — ED Provider Notes (Signed)
History     CSN: [id number]  Arrival date & time 04/15/11  2230   First MD Initiated Contact with Patient 04/16/11 0319      Chief Complaint  Patient presents with  . Migraine    Patient is a 56 y.o. female presenting with migraine. The history is provided by the patient.  Migraine   the patient reports approximately 24 hours of bitemporal headache without vision changes.  She reports nausea without vomiting.  She reports photophobia and phonophobia.  This is similar to her prior headaches.  She denies diarrhea.  She denies neck pain or neck stiffness.  She denies fever or chills.  She denies unilateral arm or leg weakness.  She denies numbness.  Nothing improves her symptoms.  Her symptoms are worsened by light and movement.  Her pain is severe at this time  Past Medical History  Diagnosis Date  . Hypertension   . Migraine headache     Past Surgical History  Procedure Date  . Carpal tunnel release   . Abdominal hysterectomy     No family history on file.  History  Substance Use Topics  . Smoking status: Current Everyday Smoker  . Smokeless tobacco: Not on file  . Alcohol Use: No    OB History    Grav Para Term Preterm Abortions TAB SAB Ect Mult Living                  Review of Systems  All other systems reviewed and are negative.    Allergies  Compazine  Home Medications   Current Outpatient Rx  Name Route Sig Dispense Refill  . AMITRIPTYLINE HCL 50 MG PO TABS Oral Take 50-100 mg by mouth at bedtime.      Marland Kitchen CLONAZEPAM 0.5 MG PO TABS Oral Take 0.5 mg by mouth at bedtime.      Marland Kitchen HYDROCHLOROTHIAZIDE 25 MG PO TABS Oral Take 25 mg by mouth every morning.      . IBUPROFEN 200 MG PO TABS Oral Take 800 mg by mouth 2 (two) times daily as needed. For pain     . NAPROXEN 500 MG PO TABS Oral Take 1 tablet (500 mg total) by mouth 2 (two) times daily. 10 tablet 0    BP 123/81  Pulse 68  Temp(Src) 98.6 F (37 C) (Oral)  Resp 16  SpO2 100%  Physical Exam    Nursing note and vitals reviewed. Constitutional: She is oriented to person, place, and time. She appears well-developed and well-nourished. No distress.  HENT:  Head: Normocephalic and atraumatic.  Eyes: EOM are normal. Pupils are equal, round, and reactive to light.  Neck: Normal range of motion.  Cardiovascular: Normal rate, regular rhythm and normal heart sounds.   Pulmonary/Chest: Effort normal and breath sounds normal.  Abdominal: Soft. She exhibits no distension. There is no tenderness.  Musculoskeletal: Normal range of motion.  Neurological: She is alert and oriented to person, place, and time.       5/5 strength in major muscle groups of  bilateral upper and lower extremities. Speech normal. No facial asymetry.   Skin: Skin is warm and dry.  Psychiatric: She has a normal mood and affect. Judgment normal.    ED Course  Procedures (including critical care time)  Labs Reviewed - No data to display No results found.   1. Headache       MDM  Typical migraine headache for the pt. Non focal neuro exam. No recent head trauma. No  fever. Doubt meningitis. Doubt intracranial bleed. Doubt normal pressure hydrocephalus. No indication for imaging. Will treat with migraine cocktail and reevaluate  4:43 AM The patient feels much better at this time.  Resolution of her headache.          Lyanne Co, MD 04/16/11 [phone]

## 2011-04-17 ENCOUNTER — Encounter: Payer: Self-pay | Admitting: Internal Medicine

## 2011-05-12 ENCOUNTER — Other Ambulatory Visit: Payer: Self-pay | Admitting: Internal Medicine

## 2011-05-12 DIAGNOSIS — Z1231 Encounter for screening mammogram for malignant neoplasm of breast: Secondary | ICD-10-CM

## 2011-06-09 ENCOUNTER — Ambulatory Visit (AMBULATORY_SURGERY_CENTER): Payer: Medicare Other | Admitting: *Deleted

## 2011-06-09 VITALS — Ht 66.0 in | Wt 165.0 lb

## 2011-06-09 DIAGNOSIS — Z1211 Encounter for screening for malignant neoplasm of colon: Secondary | ICD-10-CM

## 2011-06-09 MED ORDER — PEG-KCL-NACL-NASULF-NA ASC-C 100 G PO SOLR: ORAL | Status: DC

## 2011-06-09 NOTE — Progress Notes (Signed)
Patient states she has no problems with sedation in the past.

## 2011-06-23 ENCOUNTER — Encounter: Payer: Self-pay | Admitting: Internal Medicine

## 2011-06-23 ENCOUNTER — Ambulatory Visit (AMBULATORY_SURGERY_CENTER): Payer: Medicare Other | Admitting: Internal Medicine

## 2011-06-23 VITALS — BP 113/76 | HR 80 | Temp 97.2°F | Resp 18 | Ht 66.0 in | Wt 165.0 lb

## 2011-06-23 DIAGNOSIS — Z1211 Encounter for screening for malignant neoplasm of colon: Secondary | ICD-10-CM

## 2011-06-23 MED ORDER — SODIUM CHLORIDE 0.9 % IV SOLN: 500.0000 mL | INTRAVENOUS | Status: DC

## 2011-06-23 NOTE — Patient Instructions (Addendum)
The prep did not work as well as it should have.The medication was not working well enough to stop the pain from the procedure. The colonoscopy was not completed and will need to be rescheduled with extra prep and stronger medication for sedation (sleepiness). Iva Boop, MD, FACG  YOU HAD AN ENDOSCOPIC PROCEDURE TODAY AT THE [facility]: Refer to the procedure report that was given to you for any specific questions about what was found during the examination.  If the procedure report does not answer your questions, please call your gastroenterologist to clarify.  If you requested that your care partner not be given the details of your procedure findings, then the procedure report has been included in a sealed envelope for you to review at your convenience later.  YOU SHOULD EXPECT: Some feelings of bloating in the abdomen. Passage of more gas than usual.  Walking can help get rid of the air that was put into your GI tract during the procedure and reduce the bloating. If you had a lower endoscopy (such as a colonoscopy or flexible sigmoidoscopy) you may notice spotting of blood in your stool or on the toilet paper. If you underwent a bowel prep for your procedure, then you may not have a normal bowel movement for a few days.  DIET: Your first meal following the procedure should be a light meal and then it is ok to progress to your normal diet.  A half-sandwich or bowl of soup is an example of a good first meal.  Heavy or fried foods are harder to digest and may make you feel nauseous or bloated.  Likewise meals heavy in dairy and vegetables can cause extra gas to form and this can also increase the bloating.  Drink plenty of fluids but you should avoid alcoholic beverages for 24 hours.  ACTIVITY: Your care partner should take you home directly after the procedure.  You should plan to take it easy, moving slowly for the rest of the day.  You can resume normal activity the day after the  procedure however you should NOT DRIVE or use heavy machinery for 24 hours (because of the sedation medicines used during the test).    SYMPTOMS TO REPORT IMMEDIATELY: A gastroenterologist can be reached at any hour.  During normal business hours, 8:30 AM to 5:00 PM Monday through Friday, call [phone].  After hours and on weekends, please call the GI answering service at [phone] who will take a message and have the physician on call contact you.   Following lower endoscopy (colonoscopy or flexible sigmoidoscopy):  Excessive amounts of blood in the stool  Significant tenderness or worsening of abdominal pains  Swelling of the abdomen that is new, acute  Fever of 100F or higher  FOLLOW UP: If any biopsies were taken you will be contacted by phone or by letter within the next 1-3 weeks.  Call your gastroenterologist if you have not heard about the biopsies in 3 weeks.  Our staff will call the home number listed on your records the next business day following your procedure to check on you and address any questions or concerns that you may have at that time regarding the information given to you following your procedure. This is a courtesy call and so if there is no answer at the home number and we have not heard from you through the emergency physician on call, we will assume that you have returned to your regular daily activities without incident.  SIGNATURES/CONFIDENTIALITY: You and/or your care partner have signed paperwork which will be entered into your electronic medical record.  These signatures attest to the fact that that the information above on your After Visit Summary has been reviewed and is understood.  Full responsibility of the confidentiality of this discharge information lies with you and/or your care-partner.

## 2011-06-23 NOTE — Progress Notes (Signed)
Patient did not experience any of the following events: a burn prior to discharge; a fall within the facility; wrong site/side/patient/procedure/implant event; or a hospital transfer or hospital admission upon discharge from the facility. (G8907) Patient did not have preoperative order for IV antibiotic SSI prophylaxis. (G8918)  

## 2011-06-23 NOTE — Op Note (Signed)
[facility] [address] [place], [place]  [address]  COLONOSCOPY PROCEDURE REPORT  PATIENT:  Christie Cervantes, Christie Cervantes  MR#:  [id number] BIRTHDATE:  1955-09-11, 56 yrs. old  GENDER:  female ENDOSCOPIST:  Iva Boop, MD, [facility] REF. BY:  Yisroel Ramming, M.D. PROCEDURE DATE:  06/23/2011 PROCEDURE:  Average-risk screening colonoscopy - incomplete due to poor prep and inadequate sedation effect G0121 ASA CLASS:  Class II INDICATIONS:  Routine Risk Screening MEDICATIONS:   These medications were titrated to patient response per physician's verbal order, Benadryl 12.5 mg IV, Versed 4 mg IV, Fentanyl 25 mg IV  DESCRIPTION OF PROCEDURE:   After the risks benefits and alternatives of the procedure were thoroughly explained, informed consent was obtained.  Digital rectal exam was performed and revealed no abnormalities.   The LB 180AL [id number] endoscope was introduced through the anus and advanced to the sigmoid colon, limited by extreme patient discomfort, poor preparation.    The quality of the prep was poor, using MoviPrep.  The instrument was then slowly withdrawn as the colon was fully examined. <<PROCEDUREIMAGES>>  FINDINGS:  The examination was incomplete due to poor prep and patient intolerance. Retroflexed views in the rectum revealed not done.    The scope was then withdrawn and the procedure completed. COMPLICATIONS:  None ENDOSCOPIC IMPRESSION: 1) Poor prep rectum and sigmoid - patient did not tolerate pain from scope insertion RECOMMENDATIONS: reschedule screening colonoscopy for propofol MAC sedation and 2 day prep.  Iva Boop, MD, [facility]  CC:  Yisroel Ramming, MD and The Patient  n. eSIGNED:   Iva Boop at 06/23/2011 12:22 PM  Newell Coral, [id number]

## 2011-06-24 ENCOUNTER — Telehealth: Payer: Self-pay

## 2011-06-24 NOTE — Telephone Encounter (Signed)
Left message on answering machine. 

## 2011-07-07 ENCOUNTER — Ambulatory Visit (AMBULATORY_SURGERY_CENTER): Payer: Medicare Other | Admitting: *Deleted

## 2011-07-07 VITALS — Ht 67.0 in | Wt 166.2 lb

## 2011-07-07 DIAGNOSIS — Z1211 Encounter for screening for malignant neoplasm of colon: Secondary | ICD-10-CM

## 2011-07-07 MED ORDER — PEG-KCL-NACL-NASULF-NA ASC-C 100 G PO SOLR: ORAL | Status: DC

## 2011-07-17 ENCOUNTER — Encounter: Payer: Self-pay | Admitting: Internal Medicine

## 2011-07-17 ENCOUNTER — Ambulatory Visit (AMBULATORY_SURGERY_CENTER): Payer: Medicare Other | Admitting: Internal Medicine

## 2011-07-17 VITALS — BP 120/80 | HR 75 | Temp 97.8°F | Resp 16 | Ht 67.0 in | Wt 166.0 lb

## 2011-07-17 DIAGNOSIS — Z1211 Encounter for screening for malignant neoplasm of colon: Secondary | ICD-10-CM

## 2011-07-17 MED ORDER — SODIUM CHLORIDE 0.9 % IV SOLN: 500.0000 mL | INTRAVENOUS | Status: DC

## 2011-07-17 NOTE — Progress Notes (Signed)
Patient did not experience any of the following events: a burn prior to discharge; a fall within the facility; wrong site/side/patient/procedure/implant event; or a hospital transfer or hospital admission upon discharge from the facility. (G8907) Patient did not have preoperative order for IV antibiotic SSI prophylaxis. (G8918)  

## 2011-07-17 NOTE — Patient Instructions (Signed)
Impressions/recommendations:  Normal colonoscopy.  Repeat colonoscopy in 10 years.  Resume medications as you were taking them prior to your procedure.  YOU HAD AN ENDOSCOPIC PROCEDURE TODAY AT THE [facility]: Refer to the procedure report that was given to you for any specific questions about what was found during the examination.  If the procedure report does not answer your questions, please call your gastroenterologist to clarify.  If you requested that your care partner not be given the details of your procedure findings, then the procedure report has been included in a sealed envelope for you to review at your convenience later.  YOU SHOULD EXPECT: Some feelings of bloating in the abdomen. Passage of more gas than usual.  Walking can help get rid of the air that was put into your GI tract during the procedure and reduce the bloating. If you had a lower endoscopy (such as a colonoscopy or flexible sigmoidoscopy) you may notice spotting of blood in your stool or on the toilet paper. If you underwent a bowel prep for your procedure, then you may not have a normal bowel movement for a few days.  DIET: Your first meal following the procedure should be a light meal and then it is ok to progress to your normal diet.  A half-sandwich or bowl of soup is an example of a good first meal.  Heavy or fried foods are harder to digest and may make you feel nauseous or bloated.  Likewise meals heavy in dairy and vegetables can cause extra gas to form and this can also increase the bloating.  Drink plenty of fluids but you should avoid alcoholic beverages for 24 hours.  ACTIVITY: Your care partner should take you home directly after the procedure.  You should plan to take it easy, moving slowly for the rest of the day.  You can resume normal activity the day after the procedure however you should NOT DRIVE or use heavy machinery for 24 hours (because of the sedation medicines used during the test).      SYMPTOMS TO REPORT IMMEDIATELY: A gastroenterologist can be reached at any hour.  During normal business hours, 8:30 AM to 5:00 PM Monday through Friday, call [phone].  After hours and on weekends, please call the GI answering service at [phone] who will take a message and have the physician on call contact you.   Following lower endoscopy (colonoscopy or flexible sigmoidoscopy):  Excessive amounts of blood in the stool  Significant tenderness or worsening of abdominal pains  Swelling of the abdomen that is new, acute  Fever of 100F or higher  Following upper endoscopy (EGD)  Vomiting of blood or coffee ground material  New chest pain or pain under the shoulder blades  Painful or persistently difficult swallowing  New shortness of breath  Fever of 100F or higher  Black, tarry-looking stools  FOLLOW UP: If any biopsies were taken you will be contacted by phone or by letter within the next 1-3 weeks.  Call your gastroenterologist if you have not heard about the biopsies in 3 weeks.  Our staff will call the home number listed on your records the next business day following your procedure to check on you and address any questions or concerns that you may have at that time regarding the information given to you following your procedure. This is a courtesy call and so if there is no answer at the home number and we have not heard from you through the  emergency physician on call, we will assume that you have returned to your regular daily activities without incident.  SIGNATURES/CONFIDENTIALITY: You and/or your care partner have signed paperwork which will be entered into your electronic medical record.  These signatures attest to the fact that that the information above on your After Visit Summary has been reviewed and is understood.  Full responsibility of the confidentiality of this discharge information lies with you and/or your care-partner.

## 2011-07-17 NOTE — Op Note (Signed)
[facility] [address] [place], [place]  [address]  COLONOSCOPY PROCEDURE REPORT  PATIENT:  Christie Cervantes, Christie Cervantes  MR#:  [id number] BIRTHDATE:  07/28/1955, 56 yrs. old  GENDER:  female ENDOSCOPIST:  Iva Boop, MD, [facility] REF. BY:           Yisroel Ramming, MD PROCEDURE DATE:  07/17/2011 PROCEDURE:  Average-risk screening colonoscopy G0121 ASA CLASS:  Class II INDICATIONS:  Routine Risk Screening MEDICATIONS:   MAC sedation, administered by CRNA, propofol (Diprivan) 260 mg IV  DESCRIPTION OF PROCEDURE:   After the risks benefits and alternatives of the procedure were thoroughly explained, informed consent was obtained.  Digital rectal exam was performed and revealed no abnormalities.   The LB CF-H180AL [id number] endoscope was introduced through the anus and advanced to the cecum, which was identified by both the appendix and ileocecal valve, without limitations.  The quality of the prep was excellent, using MoviPrep(2 day prep).  The instrument was then slowly withdrawn as the colon was fully examined. <<PROCEDUREIMAGES>>  FINDINGS:  A normal appearing cecum, ileocecal valve, and appendiceal orifice were identified. The ascending, hepatic flexure, transverse, splenic flexure, descending, sigmoid colon, and rectum appeared unremarkable.   Retroflexed views in the rectum revealed no abnormalities.    The time to cecum = 5:28 minutes. The scope was then withdrawn in 9:00 minutes from the cecum and the procedure completed. COMPLICATIONS:  None ENDOSCOPIC IMPRESSION: 1) Normal colonoscopy with excellent prep (2 day prep)  REPEAT EXAM:  In 10 year(s) for routine screening colonoscopy.  Iva Boop, MD, [facility]  CC:  Yisroel Ramming, MD and The Patient  n. eSIGNED:   Iva Boop at 07/17/2011 03:58 PM  Newell Coral, [phone]

## 2011-07-18 ENCOUNTER — Telehealth: Payer: Self-pay | Admitting: *Deleted

## 2011-07-18 NOTE — Telephone Encounter (Signed)
No answer. Left message to call offfice if questions or concerns.

## 2011-07-31 ENCOUNTER — Ambulatory Visit (HOSPITAL_COMMUNITY)
Admission: RE | Admit: 2011-07-31 | Discharge: 2011-07-31 | Disposition: A | Discharge status: Home / Self Care | Payer: Medicare Other | Source: Ambulatory Visit | Attending: Internal Medicine | Admitting: Internal Medicine

## 2011-07-31 DIAGNOSIS — Z1231 Encounter for screening mammogram for malignant neoplasm of breast: Secondary | ICD-10-CM | POA: Insufficient documentation

## 2012-02-23 ENCOUNTER — Ambulatory Visit: Payer: Medicare Other | Attending: Anesthesiology | Admitting: Physical Therapy

## 2012-02-23 DIAGNOSIS — M545 Low back pain, unspecified: Secondary | ICD-10-CM | POA: Insufficient documentation

## 2012-02-23 DIAGNOSIS — IMO0001 Reserved for inherently not codable concepts without codable children: Secondary | ICD-10-CM | POA: Insufficient documentation

## 2012-02-23 DIAGNOSIS — R293 Abnormal posture: Secondary | ICD-10-CM | POA: Insufficient documentation

## 2012-02-25 ENCOUNTER — Ambulatory Visit: Payer: Medicare Other | Admitting: Physical Therapy

## 2012-03-02 ENCOUNTER — Encounter: Payer: Medicare Other | Admitting: Physical Therapy

## 2012-03-10 ENCOUNTER — Encounter: Payer: Medicare Other | Admitting: Physical Therapy

## 2012-03-15 ENCOUNTER — Encounter: Payer: Medicare Other | Admitting: Physical Therapy

## 2012-03-22 ENCOUNTER — Encounter: Payer: Medicare Other | Admitting: Physical Therapy

## 2012-06-18 ENCOUNTER — Other Ambulatory Visit (HOSPITAL_COMMUNITY): Payer: Self-pay | Admitting: Anesthesiology

## 2012-06-23 ENCOUNTER — Ambulatory Visit (HOSPITAL_COMMUNITY)
Admission: RE | Admit: 2012-06-23 | Discharge: 2012-06-23 | Disposition: A | Discharge status: Home / Self Care | Payer: Medicare Other | Source: Ambulatory Visit | Attending: Anesthesiology | Admitting: Anesthesiology

## 2012-06-23 DIAGNOSIS — M51379 Other intervertebral disc degeneration, lumbosacral region without mention of lumbar back pain or lower extremity pain: Secondary | ICD-10-CM | POA: Insufficient documentation

## 2012-06-23 DIAGNOSIS — M545 Low back pain, unspecified: Secondary | ICD-10-CM | POA: Insufficient documentation

## 2012-06-23 DIAGNOSIS — M79609 Pain in unspecified limb: Secondary | ICD-10-CM | POA: Insufficient documentation

## 2012-06-23 DIAGNOSIS — M5137 Other intervertebral disc degeneration, lumbosacral region: Secondary | ICD-10-CM | POA: Insufficient documentation

## 2012-06-23 DIAGNOSIS — R109 Unspecified abdominal pain: Secondary | ICD-10-CM | POA: Insufficient documentation

## 2012-07-01 ENCOUNTER — Other Ambulatory Visit (HOSPITAL_COMMUNITY): Payer: Self-pay | Admitting: Physician Assistant

## 2012-07-01 DIAGNOSIS — Z1231 Encounter for screening mammogram for malignant neoplasm of breast: Secondary | ICD-10-CM

## 2012-08-02 ENCOUNTER — Ambulatory Visit (HOSPITAL_COMMUNITY): Payer: Medicare Other

## 2012-08-10 ENCOUNTER — Ambulatory Visit (HOSPITAL_COMMUNITY): Payer: Medicare Other | Attending: Physician Assistant

## 2012-11-22 IMAGING — CR DG WRIST COMPLETE 3+V*R*
4 series · 4 of 4 positions shown · non-contrast
Comparison: None.

CLINICAL DATA: 55-year-old female with pain and swelling times 1
month.

RIGHT WRIST - COMPLETE 3+ VIEW

[x wrist pa right]
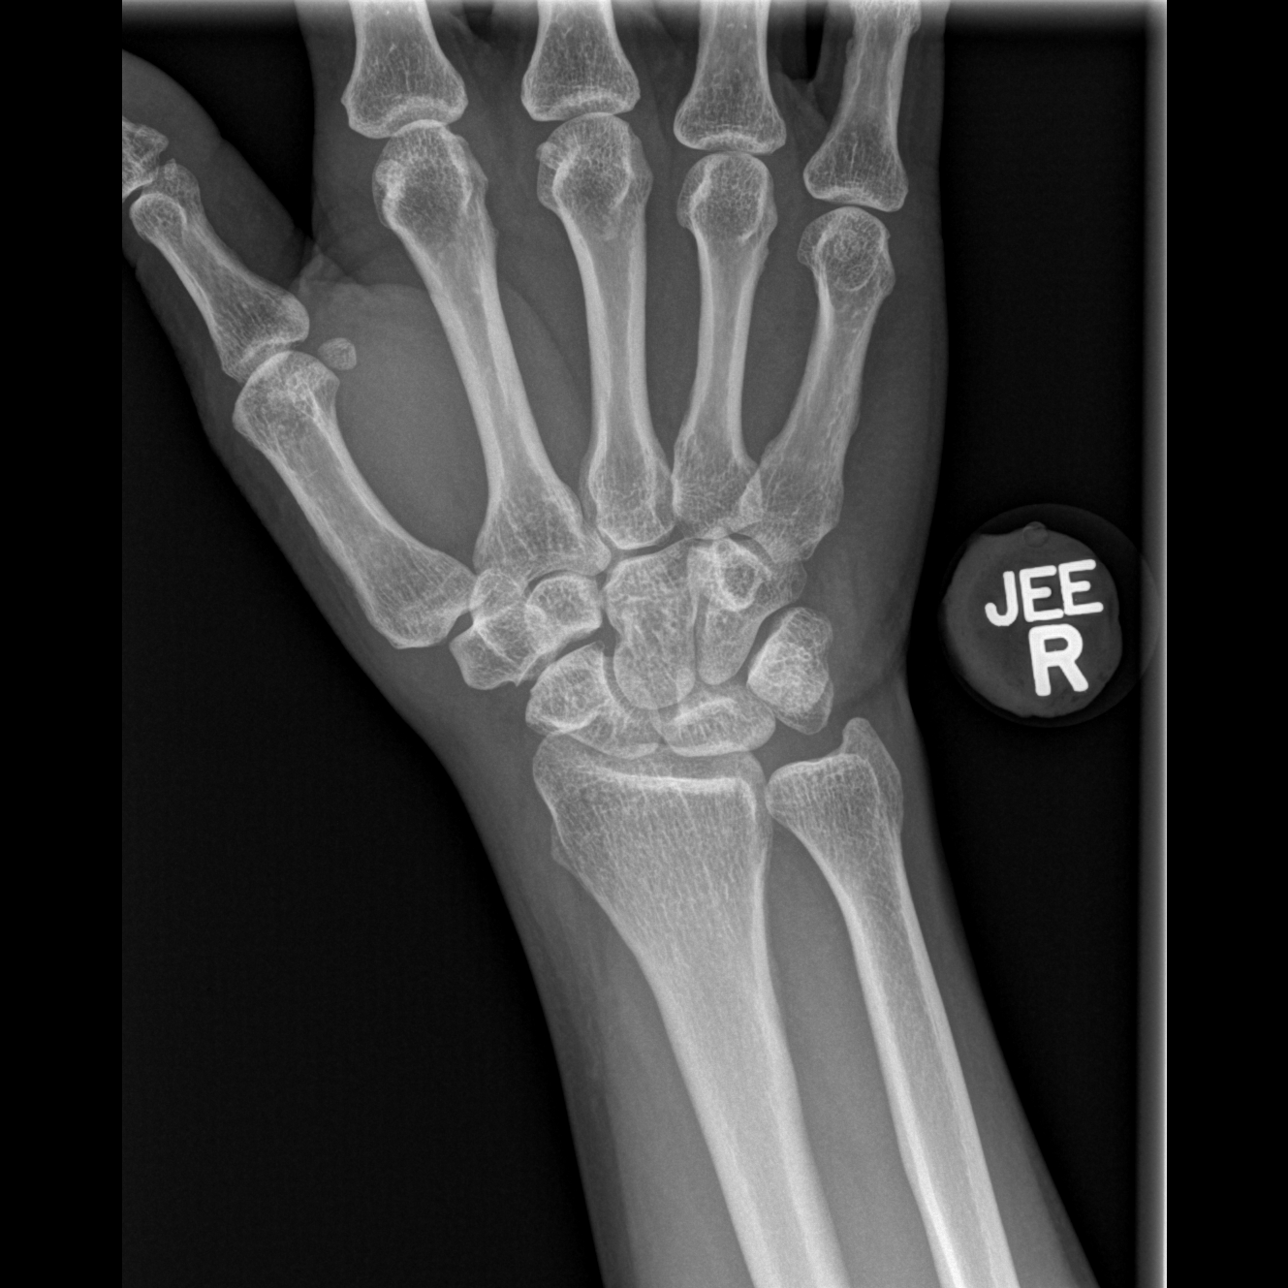

[x wrist obl right]
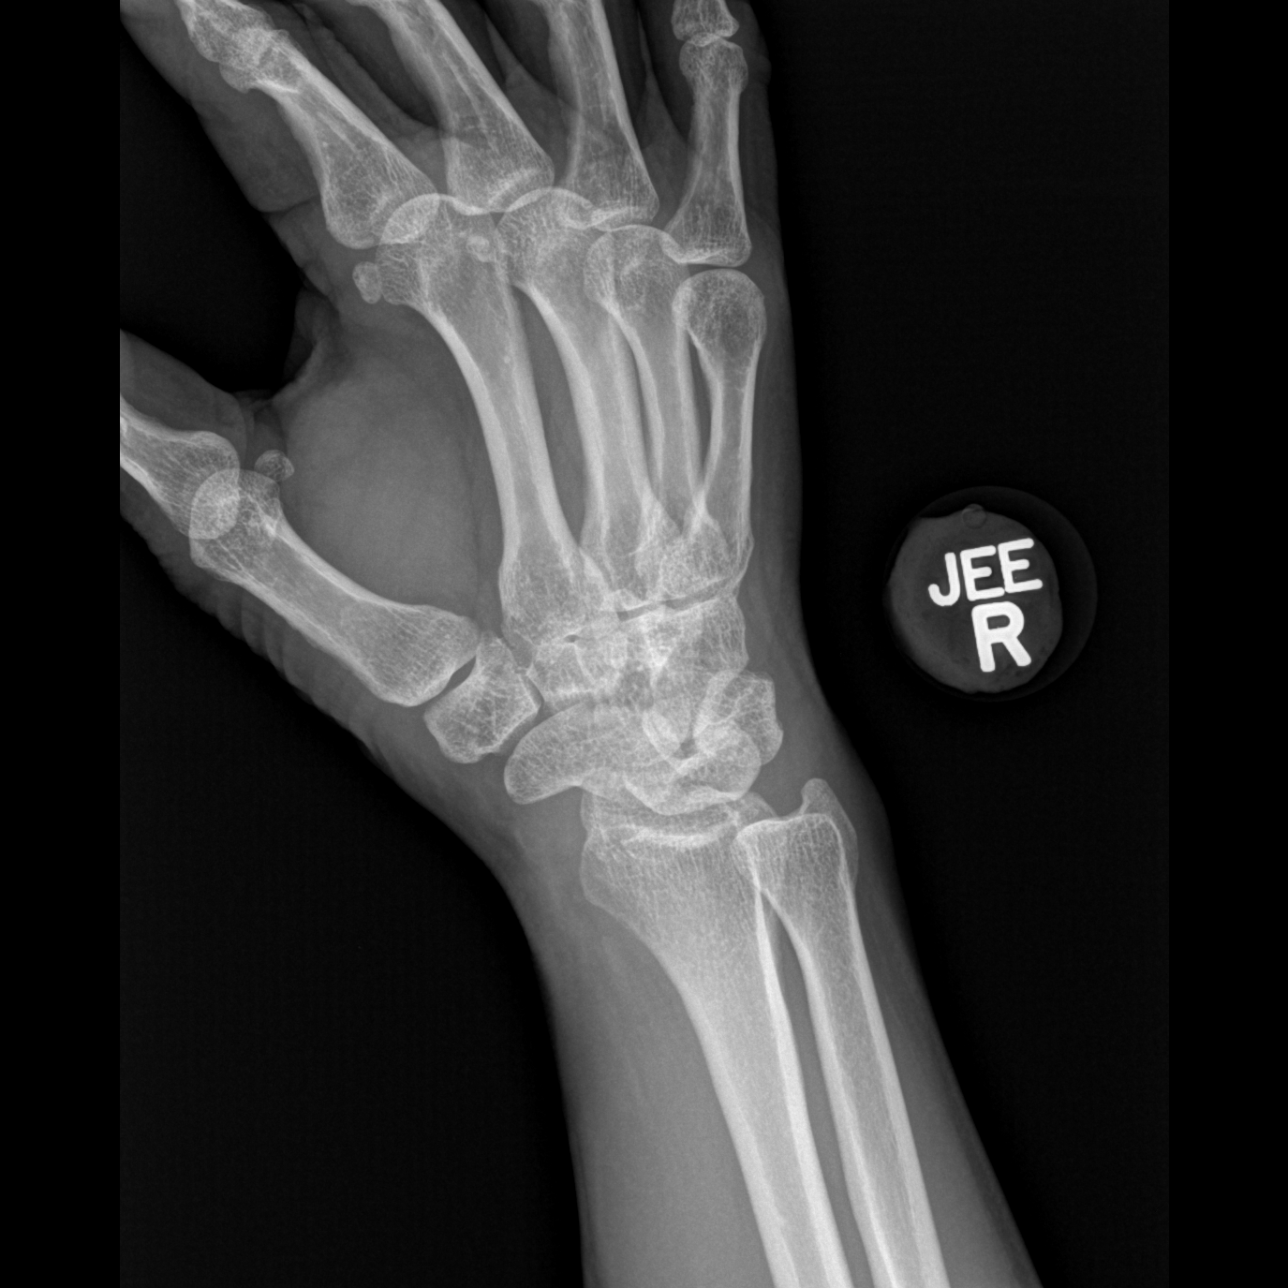

[x wrist lat right]
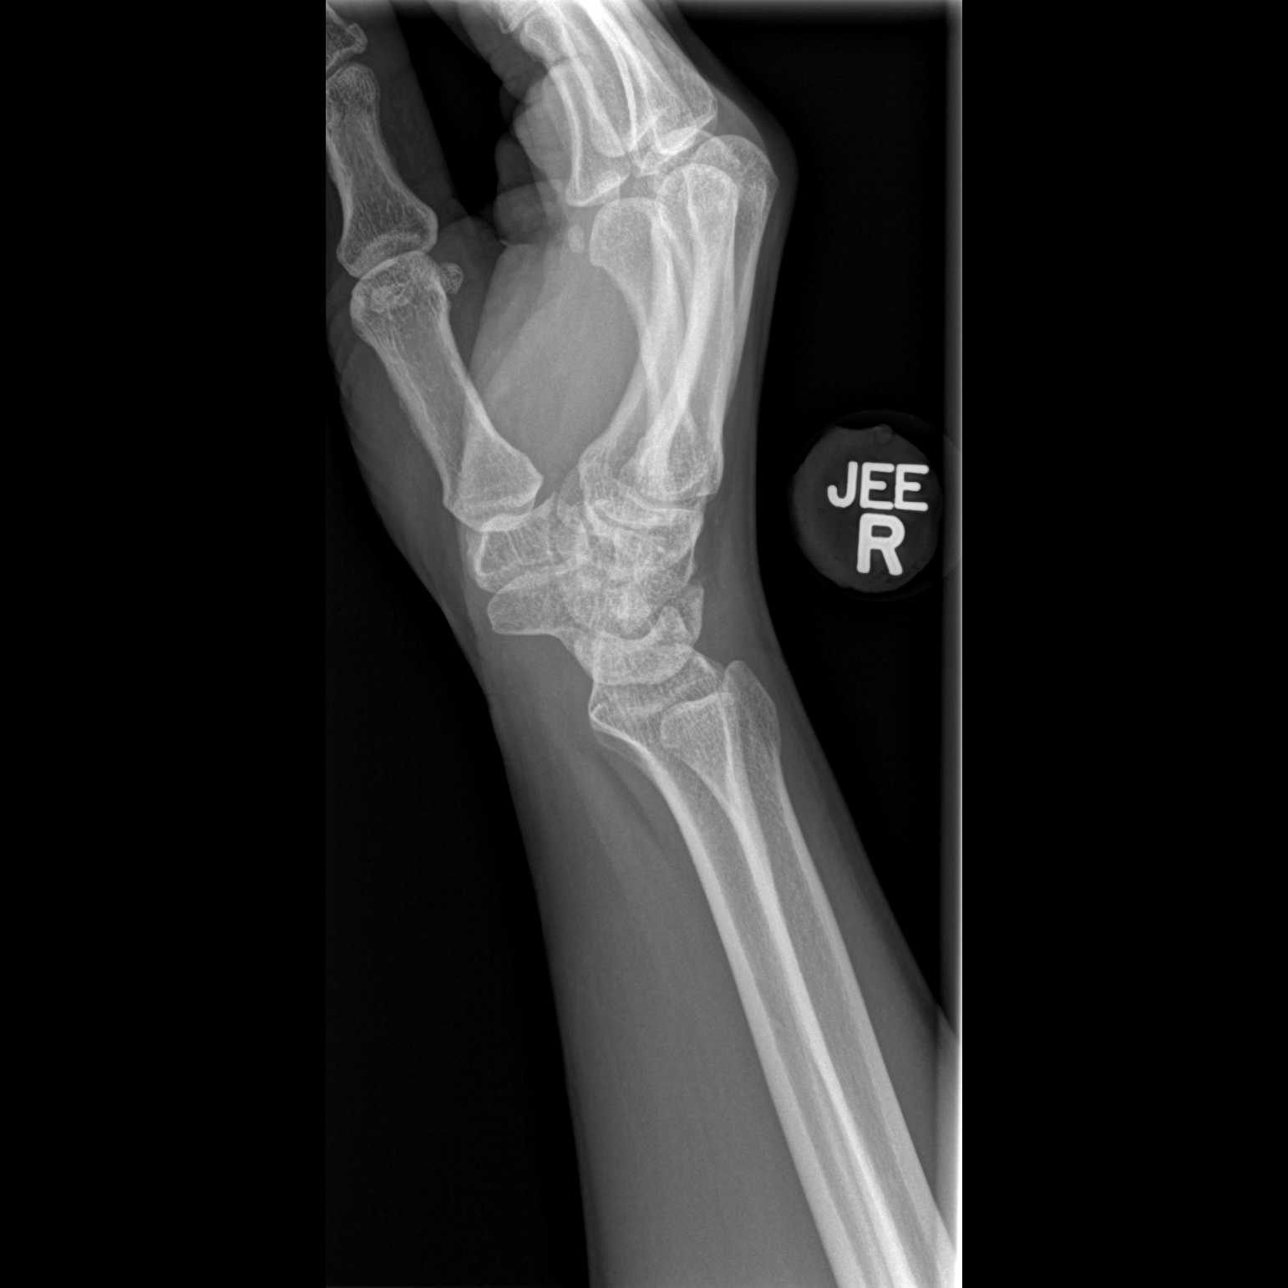

[x navicular]
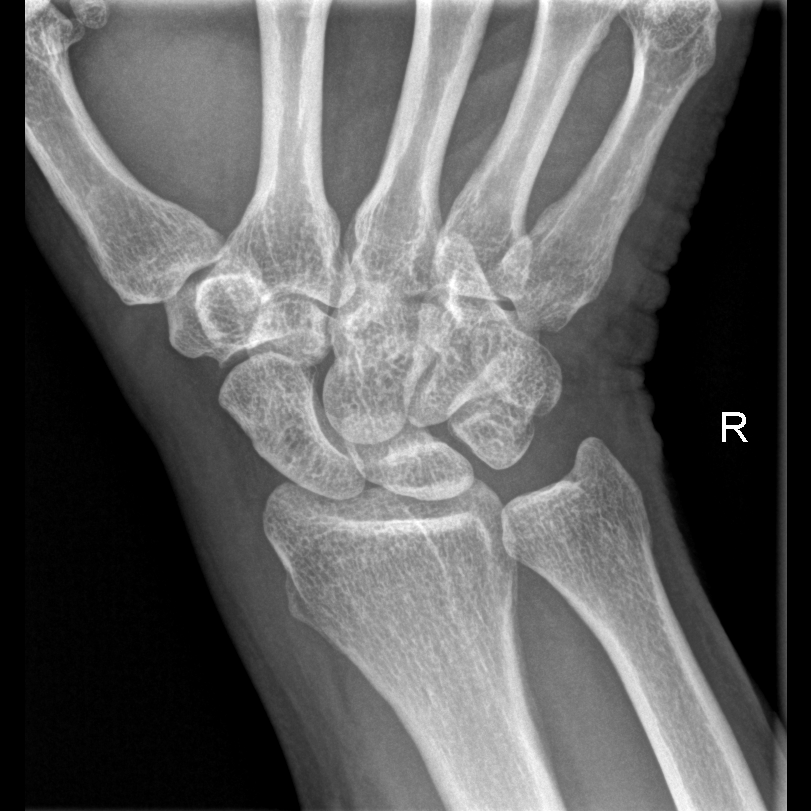

[4 of 4 positions shown; findings below may reference images not displayed]

FINDINGS: Bone mineralization is within normal limits.  Distal
radius and ulna intact.  Carpal bone alignment within normal
limits.  Joint spaces preserved.  Scaphoid intact.  No acute
fracture. No radiopaque foreign body identified.  No subcutaneous
gas.
IMPRESSION: No acute osseous abnormality identified about the right wrist.

## 2013-05-23 ENCOUNTER — Ambulatory Visit (HOSPITAL_COMMUNITY): Payer: Medicare Other

## 2013-06-02 ENCOUNTER — Ambulatory Visit (HOSPITAL_COMMUNITY): Payer: Medicare Other

## 2013-06-08 ENCOUNTER — Ambulatory Visit (HOSPITAL_COMMUNITY): Payer: Medicare Other | Attending: Physician Assistant

## 2013-06-17 ENCOUNTER — Other Ambulatory Visit (HOSPITAL_COMMUNITY): Payer: Self-pay | Admitting: Nurse Practitioner

## 2013-06-17 DIAGNOSIS — Z1231 Encounter for screening mammogram for malignant neoplasm of breast: Secondary | ICD-10-CM

## 2013-06-20 ENCOUNTER — Ambulatory Visit (HOSPITAL_COMMUNITY)
Admission: RE | Admit: 2013-06-20 | Discharge: 2013-06-20 | Disposition: A | Discharge status: Home / Self Care | Payer: Medicare Other | Source: Ambulatory Visit | Attending: Nurse Practitioner | Admitting: Nurse Practitioner

## 2013-06-20 ENCOUNTER — Ambulatory Visit (HOSPITAL_COMMUNITY): Payer: Medicare Other

## 2013-06-20 DIAGNOSIS — Z1231 Encounter for screening mammogram for malignant neoplasm of breast: Secondary | ICD-10-CM

## 2013-06-21 ENCOUNTER — Other Ambulatory Visit: Payer: Self-pay | Admitting: Nurse Practitioner

## 2013-06-21 DIAGNOSIS — R928 Other abnormal and inconclusive findings on diagnostic imaging of breast: Secondary | ICD-10-CM

## 2013-07-01 ENCOUNTER — Ambulatory Visit
Admission: RE | Admit: 2013-07-01 | Discharge: 2013-07-01 | Disposition: A | Discharge status: Home / Self Care | Payer: Medicare Other | Source: Ambulatory Visit | Attending: Nurse Practitioner | Admitting: Nurse Practitioner

## 2013-07-01 DIAGNOSIS — R928 Other abnormal and inconclusive findings on diagnostic imaging of breast: Secondary | ICD-10-CM

## 2013-11-28 ENCOUNTER — Other Ambulatory Visit: Payer: Self-pay | Admitting: Family Medicine

## 2013-11-28 DIAGNOSIS — R921 Mammographic calcification found on diagnostic imaging of breast: Secondary | ICD-10-CM

## 2013-12-13 ENCOUNTER — Ambulatory Visit
Admission: RE | Admit: 2013-12-13 | Discharge: 2013-12-13 | Disposition: A | Discharge status: Home / Self Care | Payer: Medicare Other | Source: Ambulatory Visit | Attending: Family Medicine | Admitting: Family Medicine

## 2013-12-13 ENCOUNTER — Encounter (INDEPENDENT_AMBULATORY_CARE_PROVIDER_SITE_OTHER): Payer: Self-pay

## 2013-12-13 DIAGNOSIS — R921 Mammographic calcification found on diagnostic imaging of breast: Secondary | ICD-10-CM

## 2014-05-12 ENCOUNTER — Other Ambulatory Visit: Payer: Self-pay | Admitting: Family Medicine

## 2014-05-12 DIAGNOSIS — R921 Mammographic calcification found on diagnostic imaging of breast: Secondary | ICD-10-CM

## 2014-06-22 ENCOUNTER — Ambulatory Visit
Admission: RE | Admit: 2014-06-22 | Discharge: 2014-06-22 | Disposition: A | Discharge status: Home / Self Care | Payer: Medicare Other | Source: Ambulatory Visit | Attending: Family Medicine | Admitting: Family Medicine

## 2014-06-22 DIAGNOSIS — R921 Mammographic calcification found on diagnostic imaging of breast: Secondary | ICD-10-CM

## 2015-08-06 ENCOUNTER — Other Ambulatory Visit: Payer: Self-pay

## 2015-08-06 DIAGNOSIS — Z1231 Encounter for screening mammogram for malignant neoplasm of breast: Secondary | ICD-10-CM

## 2015-08-21 ENCOUNTER — Ambulatory Visit
Admission: RE | Admit: 2015-08-21 | Discharge: 2015-08-21 | Disposition: A | Discharge status: Home / Self Care | Payer: Medicare Other | Source: Ambulatory Visit

## 2015-08-21 DIAGNOSIS — Z1231 Encounter for screening mammogram for malignant neoplasm of breast: Secondary | ICD-10-CM

## 2016-11-18 ENCOUNTER — Other Ambulatory Visit: Payer: Self-pay | Admitting: Physician Assistant

## 2016-11-18 DIAGNOSIS — Z1231 Encounter for screening mammogram for malignant neoplasm of breast: Secondary | ICD-10-CM

## 2016-12-03 ENCOUNTER — Ambulatory Visit
Admission: RE | Admit: 2016-12-03 | Discharge: 2016-12-03 | Disposition: A | Discharge status: Home / Self Care | Payer: Medicare Other | Source: Ambulatory Visit | Attending: Physician Assistant | Admitting: Physician Assistant

## 2016-12-03 ENCOUNTER — Ambulatory Visit: Payer: Medicare Other

## 2016-12-03 DIAGNOSIS — Z1231 Encounter for screening mammogram for malignant neoplasm of breast: Secondary | ICD-10-CM

## 2018-07-21 ENCOUNTER — Emergency Department (HOSPITAL_COMMUNITY): Payer: Medicare Other

## 2018-07-21 ENCOUNTER — Emergency Department (HOSPITAL_COMMUNITY)
Admission: EM | Admit: 2018-07-21 | Discharge: 2018-07-22 | Disposition: A | Discharge status: Home / Self Care | Payer: Medicare Other | Attending: Emergency Medicine | Admitting: Emergency Medicine

## 2018-07-21 ENCOUNTER — Encounter (HOSPITAL_COMMUNITY): Payer: Self-pay

## 2018-07-21 DIAGNOSIS — F17210 Nicotine dependence, cigarettes, uncomplicated: Secondary | ICD-10-CM | POA: Insufficient documentation

## 2018-07-21 DIAGNOSIS — R0609 Other forms of dyspnea: Secondary | ICD-10-CM | POA: Insufficient documentation

## 2018-07-21 DIAGNOSIS — K047 Periapical abscess without sinus: Secondary | ICD-10-CM

## 2018-07-21 DIAGNOSIS — N189 Chronic kidney disease, unspecified: Secondary | ICD-10-CM | POA: Insufficient documentation

## 2018-07-21 DIAGNOSIS — R002 Palpitations: Secondary | ICD-10-CM | POA: Diagnosis present

## 2018-07-21 DIAGNOSIS — I129 Hypertensive chronic kidney disease with stage 1 through stage 4 chronic kidney disease, or unspecified chronic kidney disease: Secondary | ICD-10-CM | POA: Insufficient documentation

## 2018-07-21 DIAGNOSIS — K029 Dental caries, unspecified: Secondary | ICD-10-CM | POA: Insufficient documentation

## 2018-07-21 DIAGNOSIS — N289 Disorder of kidney and ureter, unspecified: Secondary | ICD-10-CM

## 2018-07-21 LAB — BASIC METABOLIC PANEL
Anion gap: 12 (ref 5–15)
BUN: 14 mg/dL (ref 8–23)
CO2: 28 mmol/L (ref 22–32)
Calcium: 11.3 mg/dL — ABNORMAL HIGH (ref 8.9–10.3)
Chloride: 95 mmol/L — ABNORMAL LOW (ref 98–111)
Creatinine, Ser: 1.51 mg/dL — ABNORMAL HIGH (ref 0.44–1.00)
GFR calc Af Amer: 42 mL/min — ABNORMAL LOW (ref >60)
GFR calc non Af Amer: 37 mL/min — ABNORMAL LOW (ref >60)
Glucose, Bld: 153 mg/dL — ABNORMAL HIGH (ref 70–99)
Potassium: 3.9 mmol/L (ref 3.5–5.1)
Sodium: 135 mmol/L (ref 135–145)

## 2018-07-21 LAB — CBC
HCT: 41.7 % (ref 36.0–46.0)
Hemoglobin: 13.5 g/dL (ref 12.0–15.0)
MCH: 29.3 pg (ref 26.0–34.0)
MCHC: 32.4 g/dL (ref 30.0–36.0)
MCV: 90.5 fL (ref 80.0–100.0)
Platelets: 252 10*3/uL (ref 150–400)
RBC: 4.61 MIL/uL (ref 3.87–5.11)
RDW: 12 % (ref 11.5–15.5)
WBC: 6.4 10*3/uL (ref 4.0–10.5)
nRBC: 0 % (ref 0.0–0.2)

## 2018-07-21 MED ORDER — SODIUM CHLORIDE 0.9% FLUSH: 3.0000 mL | Freq: Once | INTRAVENOUS | Status: DC

## 2018-07-21 NOTE — ED Triage Notes (Addendum)
Patient reports "really fast heart beat and heavy breathing" since Sunday every time she gets up. Reports calling EMS Sunday they checked her out and vital signs were stable. Went to PCP Monday but states "they didn't check everything. What if I have an infection? They wouldn't know because they didn't check it". States "I do have a rotten tooth, it's black and it's probably causing an infection". Denies fever, chest pain, abdominal pain, nausea, vomiting, or diarrhea.

## 2018-07-22 ENCOUNTER — Other Ambulatory Visit: Payer: Self-pay

## 2018-07-22 LAB — HEPATIC FUNCTION PANEL
ALT: 21 U/L (ref 0–44)
AST: 24 U/L (ref 15–41)
Albumin: 4.1 g/dL (ref 3.5–5.0)
Alkaline Phosphatase: 64 U/L (ref 38–126)
Bilirubin, Direct: <0.1 mg/dL (ref 0.0–0.2)
Total Bilirubin: 0.4 mg/dL (ref 0.3–1.2)
Total Protein: 9 g/dL — ABNORMAL HIGH (ref 6.5–8.1)

## 2018-07-22 LAB — BRAIN NATRIURETIC PEPTIDE: B Natriuretic Peptide: 12.7 pg/mL (ref 0.0–100.0)

## 2018-07-22 LAB — D-DIMER, QUANTITATIVE (NOT AT ARMC): D-Dimer, Quant: 0.46 ug/mL-FEU (ref 0.00–0.50)

## 2018-07-22 LAB — TROPONIN I: Troponin I: <0.03 ng/mL (ref <0.03)

## 2018-07-22 MED ORDER — PENICILLIN V POTASSIUM 500 MG PO TABS: 500.0000 mg | ORAL_TABLET | Freq: Three times a day (TID) | ORAL | 0 refills | Status: DC

## 2018-07-22 NOTE — Discharge Instructions (Addendum)
Evaluation today did not find a cause for why you have been getting so short of breath.  Please follow-up with a cardiologist for further outpatient evaluation.  Return if you feel symptoms are getting worse.  Your teeth are in bad shape.  He will need to see a dentist about having the remaining teeth extracted.  Your calcium was a little high today, and kidney function is not as good as it should be.  Please follow-up with your primary care physician to monitor both of those.

## 2018-07-22 NOTE — ED Provider Notes (Addendum)
[facility]   CSN: [id number] Arrival date & time: 07/21/18  2154    History   Chief Complaint Chief Complaint  Patient presents with  . Palpitations    HPI Christie Cervantes is a 63 y.o. female.  The history is provided by the patient.  Palpitations  She has history of hypertension, hyperlipidemia, depression and comes in because of exertional dyspnea and palpitations for the last week.  She initially noted it when she went up a flight of steps, but now she notices it with even walking a few feet.  She denies chest pain, heaviness, tightness, pressure.  Dyspnea and palpitations will last several minutes before resolving.  She denies nausea, vomiting, diaphoresis.  She is concerned because she has bad teeth in her mouth and has a pad taste there and she thinks that infection is causing her problems.  Past Medical History:  Diagnosis Date  . DDD (degenerative disc disease)    back  . Depression   . Hypertension   . Migraine headache   . Tinnitus     Patient Active Problem List   Diagnosis Date Noted  . INSOMNIA 12/06/2009  . BACTERIAL VAGINITIS 07/17/2009  . DYSURIA 07/17/2009  . DENTAL CARIES 02/25/2008  . DYSLIPIDEMIA 11/16/2006  . MIGRAINE HEADACHE 11/16/2006  . CARPAL TUNNEL SYNDROME, BILATERAL 11/16/2006  . HYPERTENSION 11/16/2006  . DEGENERATIVE DISC DISEASE, LUMBOSACRAL SPINE 11/16/2006  . DEPRESSION, MAJOR, RECURRENT, SEVERE 04/07/2001    Past Surgical History:  Procedure Laterality Date  . ABDOMINAL HYSTERECTOMY    . APPENDECTOMY    . CARPAL TUNNEL RELEASE     pt denies surgery  . UTERINE FIBROID SURGERY     x2     OB History   No obstetric history on file.      Home Medications    Prior to Admission medications   Medication Sig Start Date End Date Taking? Authorizing Provider  amitriptyline (ELAVIL) 50 MG tablet Take 50-100 mg by mouth at bedtime.      [provider]  clonazePAM  (KLONOPIN) 0.5 MG tablet Take 0.5 mg by mouth at bedtime.      [provider]  FLUoxetine (PROZAC) 20 MG capsule Take 20 mg by mouth daily.    [provider]  hydrochlorothiazide (HYDRODIURIL) 25 MG tablet Take 25 mg by mouth every morning.      [provider]  morphine (MS CONTIN) 60 MG 12 hr tablet Take 60 mg by mouth 2 (two) times daily.    [provider]    Family History Family History  Problem Relation Age of Onset  . Colon cancer Neg Hx     Social History Social History   Tobacco Use  . Smoking status: Current Every Day Smoker    Packs/day: 0.50    Years: 20.00    Pack years: 10.00    Types: Cigarettes  . Smokeless tobacco: Never Used  Substance Use Topics  . Alcohol use: No  . Drug use: No     Allergies   Compazine [prochlorperazine maleate]   Review of Systems Review of Systems  Cardiovascular: Positive for palpitations.  All other systems reviewed and are negative.    Physical Exam Updated Vital Signs BP (!) 137/93 (BP Location: Right Arm)   Pulse 86   Temp 98.4 F (36.9 C) (Oral)   Resp 18   SpO2 100%   Physical Exam Vitals signs and nursing note reviewed.    63  year old female, resting comfortably and in no acute distress. Vital signs are significant for elevated diastolic blood pressure. Oxygen saturation is 100%, which is normal. Head is normocephalic and atraumatic. PERRLA, EOMI. Oropharynx is clear.  She is mostly edentulous, but has several incisors which are rotted down to the gingiva without obvious underlying infection. Neck is nontender and supple without adenopathy or JVD. Back is nontender and there is no CVA tenderness. Lungs are clear without rales, wheezes, or rhonchi. Chest is nontender. Heart has regular rate and rhythm without murmur. Abdomen is soft, flat, nontender without masses or hepatosplenomegaly and peristalsis is normoactive. Extremities have no cyanosis or edema, full range of  motion is present. Skin is warm and dry without rash. Neurologic: Mental status is normal, cranial nerves are intact, there are no motor or sensory deficits.  ED Treatments / Results  Labs (all labs ordered are listed, but only abnormal results are displayed) Labs Reviewed  BASIC METABOLIC PANEL - Abnormal; Notable for the following components:      Result Value   Chloride 95 (*)    Glucose, Bld 153 (*)    Creatinine, Ser 1.51 (*)    Calcium 11.3 (*)    GFR calc non Af Amer 37 (*)    GFR calc Af Amer 42 (*)    All other components within normal limits  HEPATIC FUNCTION PANEL - Abnormal; Notable for the following components:   Total Protein 9.0 (*)    All other components within normal limits  CBC  D-DIMER, QUANTITATIVE (NOT AT [facility])  TROPONIN I  BRAIN NATRIURETIC PEPTIDE    EKG EKG Interpretation  Date/Time:  Wednesday July 21 2018 22:10:54 EDT Ventricular Rate:  93 PR Interval:  136 QRS Duration: 80 QT Interval:  336 QTC Calculation: 417 R Axis:   60 Text Interpretation:  Normal sinus rhythm Biatrial enlargement Nonspecific T wave abnormality Abnormal ECG No old tracing to compare Confirmed by Dione Booze ([id number]) on 07/22/2018 12:28:11 AM   Radiology Dg Chest 2 View  Result Date: 07/21/2018 CLINICAL DATA:  Shortness of breath and tachycardia for 1 week, history of vaping EXAM: CHEST - 2 VIEW COMPARISON:  None. FINDINGS: The heart size and mediastinal contours are within normal limits. Both lungs are clear. The visualized skeletal structures are unremarkable. IMPRESSION: No active cardiopulmonary disease. Electronically Signed   By: Alcide Clever M.D.   On: 07/21/2018 22:47    Procedures Procedures   Medications Ordered in ED Medications - No data to display   Initial Impression / Assessment and Plan / ED Course  I have reviewed the triage vital signs and the nursing notes.  Pertinent labs & imaging results that were available during my care of the patient were  reviewed by me and considered in my medical decision making (see chart for details).  Exertional dyspnea.  She has normal heart rate, normal pulse ox.  ECG shows no acute process.  Hemoglobin is normal.  She has mild renal insufficiency which is new, but last creatinine on record was from 2011.  No reason to suspect acute kidney injury.  She is noted to have mild hypercalcemia, but this is likely an incidental finding.  Will check troponin and BNP.  We will also check hepatic function studies and chest x-ray.  Chest x-ray is unremarkable.  Hepatic function is normal, BNP and troponin are normal, d-dimer is normal.  Patient was ambulated and had no oxygen desaturation or tachycardia but did feel lightheaded.  Patient was reassured  regarding findings in the ED, referred to cardiology for further outpatient work-up.  She is also given a prescription for penicillin regarding her possible dental infection.  Final Clinical Impressions(s) / ED Diagnoses   Final diagnoses:  Exertional dyspnea  Renal insufficiency  Hypercalcemia  Infected dental carries    ED Discharge Orders         Ordered    penicillin v potassium (VEETID) 500 MG tablet  3 times daily     07/22/18 0320    Ambulatory referral to Cardiology     07/22/18 0320           Dione Booze, MD 07/22/18 8921    Dione Booze, MD 07/22/18 [phone]

## 2018-07-22 NOTE — ED Notes (Addendum)
Pt stated she felt dizzy transferring from bed to feet. Oxygen remained at 100% while ambulating. Gait was steady. Will continue to monitor.

## 2018-07-22 NOTE — ED Notes (Signed)
Patient verbalizes understanding of medications and discharge instructions. No further questions at this time. VSS and patient ambulatory at discharge.   

## 2018-08-04 ENCOUNTER — Telehealth: Payer: Self-pay | Admitting: Cardiology

## 2018-08-04 ENCOUNTER — Telehealth: Payer: Self-pay | Admitting: Cardiovascular Disease

## 2018-08-04 NOTE — Telephone Encounter (Signed)
Received call from patient she stated she was told to call and schedule appointment to see a heart Dr.Stated she is not a patient. She went to [facility] ED 07/21/18 and they could not find anything wrong and recommended she see a heart Dr.Stated she has been sob for the past 1 month.Stated sob worse the past 2 weeks.She gets sob with the least exertion.Stated she knows something is wrong.No chest pain.Stated she has palpitations,dizziness,feels faint at times.She would like to be seen asap.Advised to call her back # [phone].

## 2018-08-04 NOTE — Telephone Encounter (Signed)
Called x3 for pre reg °

## 2018-08-04 NOTE — Telephone Encounter (Signed)
Spoke to patient virtual visit scheduled with Dr.[place] tomorrow 08/05/18 at 10:00 am.

## 2018-08-05 ENCOUNTER — Telehealth: Payer: Self-pay | Admitting: *Deleted

## 2018-08-05 ENCOUNTER — Encounter: Payer: Self-pay | Admitting: Cardiovascular Disease

## 2018-08-05 ENCOUNTER — Telehealth (INDEPENDENT_AMBULATORY_CARE_PROVIDER_SITE_OTHER): Payer: Medicare Other | Admitting: Cardiovascular Disease

## 2018-08-05 VITALS — Wt 162.0 lb

## 2018-08-05 DIAGNOSIS — R002 Palpitations: Secondary | ICD-10-CM | POA: Diagnosis not present

## 2018-08-05 DIAGNOSIS — R55 Syncope and collapse: Secondary | ICD-10-CM

## 2018-08-05 DIAGNOSIS — R0609 Other forms of dyspnea: Secondary | ICD-10-CM | POA: Diagnosis not present

## 2018-08-05 NOTE — Telephone Encounter (Signed)
Patient enrolled for [employer] to mail a 7 day ZIO XT long term holter monitor to her home.  Instructions reviewed briefly as they are included in the moniitor kit.

## 2018-08-05 NOTE — Progress Notes (Signed)
Virtual Visit via Telephone Note   This visit type was conducted due to national recommendations for restrictions regarding the COVID-19 Pandemic (e.g. social distancing) in an effort to limit this patient's exposure and mitigate transmission in our community.  Due to Christie Cervantes co-morbid illnesses, this patient is at least at moderate risk for complications without adequate follow up.  This format is felt to be most appropriate for this patient at this time.  The patient did not have access to video technology/had technical difficulties with video requiring transitioning to audio format only (telephone).  All issues noted in this document were discussed and addressed.  No physical exam could be performed with this format.  Please refer to the patient's chart for Christie Cervantes  consent to telehealth for Christie Cervantes.   Evaluation Performed:  New visit  Date:  08/05/2018   ID:  Christie Cervantes, DOB [birth year], MRN [id number]  Patient Location: Home Provider Location: Office  PCP:  Shanon Rosser, PA-C  Cardiologist:  No primary care provider on file.  Electrophysiologist:  None   Chief Complaint:  Shortness of breath, palpitations  History of Present Illness:    Christie Cervantes is a 63 y.o. female with hypertension and hypothyroidism who is being seen today for the evaluation of palpitations at the request of Long, Scott, PA-C.  One month ago Christie Cervantes went out grocery shopping and felt extremely short of breath when carrying the groceries up the steps.  She started gasping for air and had to rip off the mask.  The following day she had a similar episode when mopping the floor.  She felt dizzy as though she was going to faint and was unable to get air.  The episode is associated with palpitations but no chest pain or pressure.  She also denies diaphoresis or nausea.  Five days later she went to the kitchen and had a more prolonged episode while walking so she called EMS. That episode lasted 7 minutes, while prior  episodes were approximately 3 minutes.  When EMS arrived Christie Cervantes BP was 140/100 and hear heart rate was normal but the symptoms had subsided.  EMS allowed Christie Cervantes to stay home.  The following day she was seen by Christie Cervantes PCP and had lab work that is not available at this time but she reports it was normal.  She notes that if she doesn't exert herself she is OK.  However, if she starts to walk quickly the symptoms recur.  Christie Cervantes was seen in the ED 07/22/2018 due to a recurrent episode of exertional dyspnea and palpitations.  In the ED blood pressure was 137/93 and Christie Cervantes heart rate was 86.  She was noted to be mostly edentulous but did have several rotting teeth.  The rest of Christie Cervantes exam was unremarkable.  EKG revealed sinus rhythm and chest x-ray was unremarkable.  Cardiac enzymes and BNP were within normal limits.  She was noted to have a creatinine of 1.5 but labs were essentially otherwise unremarkable.  She was concerned that it could be related to a dental infection.  I gave Christie Cervantes antibiotics and she was feeling better up until yesterday when she had a recurrent episode.  However, she had not been pushing herself.  Yesterday she was rushing to get dressed and again had exertional dyspnea and palpitations.  She has not experienced any lower extremity edema, orthopnea, or PND.  She continues to smoke 2 cigarettes daily and uses a vape.  She has been trying to quit and  wants to do this cold [place].  She has not been feeling anxious and reports that Christie Cervantes faith in God keeps Christie Cervantes calm.  She has not been feeling stressed lately.  She does not drink much caffeine.  The patient does not have symptoms concerning for COVID-19 infection (fever, chills, cough, or new shortness of breath).    Past Medical History:  Diagnosis Date   DDD (degenerative disc disease)    back   Depression    Hypertension    Migraine headache    Tinnitus    Past Surgical History:  Procedure Laterality Date   ABDOMINAL HYSTERECTOMY      APPENDECTOMY     CARPAL TUNNEL RELEASE     pt denies surgery   UTERINE FIBROID SURGERY     x2     Current Meds  Medication Sig   amitriptyline (ELAVIL) 50 MG tablet Take 50-100 mg by mouth at bedtime.     atorvastatin (LIPITOR) 20 MG tablet Take 20 mg by mouth daily.   clonazePAM (KLONOPIN) 0.5 MG tablet Take 0.5 mg by mouth at bedtime.     FLUoxetine (PROZAC) 20 MG capsule Take 20 mg by mouth daily.   hydrochlorothiazide (HYDRODIURIL) 25 MG tablet Take 25 mg by mouth every morning.     HYDROcodone-acetaminophen (NORCO) 7.5-325 MG tablet hydrocodone 7.5 mg-acetaminophen 325 mg tablet   levothyroxine (SYNTHROID) 75 MCG tablet    morphine (MS CONTIN) 60 MG 12 hr tablet Take 60 mg by mouth 2 (two) times daily.   penicillin v potassium (VEETID) 500 MG tablet Take 1 tablet (500 mg total) by mouth 3 (three) times daily.   Current Facility-Administered Medications for the 08/05/18 encounter (Telemedicine) with Skeet Latch, MD  Medication   0.9 %  sodium chloride infusion     Allergies:   Compazine [prochlorperazine maleate]   Social History   Tobacco Use   Smoking status: Current Every Day Smoker    Packs/day: 0.50    Years: 20.00    Pack years: 10.00    Types: Cigarettes   Smokeless tobacco: Never Used  Substance Use Topics   Alcohol use: No   Drug use: No     Family Hx: The patient's family history includes Diabetes in Christie Cervantes sister and sister; Hypercalcemia in Christie Cervantes brother; Hypertension in Christie Cervantes brother; Multiple myeloma (age of onset: 52) in Christie Cervantes mother. There is no history of Colon cancer.  ROS:   Please see the history of present illness.     All other systems reviewed and are negative.   Prior CV studies:   The following studies were reviewed today:  n/a  Labs/Other Tests and Data Reviewed:    EKG:  An ECG dated 07/22/18 was personally reviewed today and demonstrated:  sinus rhytm.  Rate 93 bpm.  Non-speciific T wave abnormalitiy.  RA  enlargement.  Recent Labs: 07/21/2018: BUN 14; Creatinine, Ser 1.51; Hemoglobin 13.5; Platelets 252; Potassium 3.9; Sodium 135 07/22/2018: ALT 21; B Natriuretic Peptide 12.7   Recent Lipid Panel Lab Results  Component Value Date/Time   CHOL 176 12/27/2009 09:51 PM   TRIG 242 (H) 12/27/2009 09:51 PM   HDL 32 (L) 12/27/2009 09:51 PM   CHOLHDL 5.5 Ratio 12/27/2009 09:51 PM   LDLCALC 96 12/27/2009 09:51 PM    Wt Readings from Last 3 Encounters:  08/05/18 162 lb (73.5 kg)  07/17/11 166 lb (75.3 kg)  07/07/11 166 lb 3.2 oz (75.4 kg)     Objective:    Wt 162 lb (73.5  kg)    BMI 25.37 kg/m  GENERAL: Sounds well.  No acute distress. NEURO:  Speech fluent.  PSYCH:  Cognitively intact, oriented to person place and time   ASSESSMENT & PLAN:    # Palpitations: # Shortness of breath:  Episodes are atypical for ischemia.  However the fact that it happens with exertion is concerning.  No arrhythmias were detected on telemetry or EKG.  However, she is not having symptoms at the time.  We will get a 7-day ZIO patch.  We will also get a ETT to see what happens with exertion.  She has already had laboratory testing.  We will need to get a copy of the labs from Christie Cervantes PCP.  She has been treated with levothyroxine for the last year and hopefully thyroid panel was drawn.  She reports not being particularly anxious.  She seems to have improved somewhat after antibiotics.  It is unclear how this would be related to an oral infection.  Endocarditis seems unlikely in the setting of a normal white blood cell count and normal H/H.  If the above studies are unremarkable we will consider getting an echocardiogram.  # Hypertension:  BP was poorly controlled in the ED and with EMS.  Other recent medical encounters show that Christie Cervantes blood pressure was better-controlled.  She will get a home monitor and track.  Continue HCTZ for now.  # Hyperlipidemia: Continue atorvastatin 66m daily.  Lipids are managed with Christie Cervantes  PCP.  # Tobacco abuse: Cessation advised.    COVID-19 Education: The signs and symptoms of COVID-19 were discussed with the patient and how to seek care for testing (follow up with PCP or arrange E-visit).  The importance of social distancing was discussed today.  Time:   Today, I have spent 35 minutes with the patient with telehealth technology discussing the above problems.     Medication Adjustments/Labs and Tests Ordered: Current medicines are reviewed at length with the patient today.  Concerns regarding medicines are outlined above.   Tests Ordered: Orders Placed This Encounter  Procedures   LONG TERM MONITOR (3-14 DAYS)   Exercise Tolerance Test    Medication Changes: No orders of the defined types were placed in this encounter.   Disposition:  Follow up with APP in 1 month.  Rilei Kravitz C. ROval Linsey MD, FThe Surgical Pavilion LLCin 4 months.  Signed, TSkeet Latch MD  08/05/2018 11:00 AM    [place]

## 2018-08-05 NOTE — Patient Instructions (Addendum)
Medication Instructions:  Your physician recommends that you continue on your current medications as directed. Please refer to the Current Medication list given to you today.  If you need a refill on your cardiac medications before your next appointment, please call your pharmacy.   Lab work: NONE   Testing/Procedures: Your physician has requested that you have an exercise tolerance test. For further information please visit [email or web address] Please also follow instruction sheet, as given. SOON   7 DAY ZIO MONITOR   Follow-Up: Your physician recommends that you schedule a follow-up appointment in: 1 MONTH WITH PA/NP  Your physician wants you to follow-up in: 4 MONTHS  You will receive a reminder letter in the mail two months in advance. If you don't receive a letter, please call our office to schedule the follow-up appointment.   Any Other Special Instructions Will Be Listed Below (If Applicable).  Exercise Stress Test An exercise stress test is a test to check how your heart works during exercise. You will need to walk on a treadmill or ride an exercise bike for this test. An electrocardiogram (ECG) will record your heartbeat when you are at rest and when you are exercising. You may have an ultrasound or nuclear test after the exercise test. The test is done to check for coronary artery disease (CAD). It is also done to:  See how well you can exercise.  Watch for high blood pressure during exercise.  Test how well you can exercise after treatment.  Check the blood flow to your arms and legs. If your test result is not normal, more testing may be needed. What happens before the procedure?  Follow instructions from your doctor about what you cannot eat or drink. ? Do not have any drinks or foods that have caffeine in them for 24 hours before the test, or as told by your doctor. This includes coffee, tea (even decaf tea), sodas, chocolate, and cocoa.  Ask your doctor about  changing or stopping your normal medicines. This is important if you: ? Take diabetes medicines. ? Take beta-blocker medicines. ? Wear a nitroglycerin patch.  If you use an inhaler, bring it with you to the test.  Do not put lotions, powders, creams, or oils on your chest before the test.  Wear comfortable shoes and clothing.  Do not use any products that have nicotine or tobacco in them, such as cigarettes and e-cigarettes. Stop using them at least 4 hours before the test. If you need help quitting, ask your doctor. What happens during the procedure?  Patches (electrodes) will be put on your chest.  Wires will be connected to the patches. The wires will send signals to a machine to record your heartbeat.  Your heart rate will be watched while you are resting and while you are exercising. Your blood pressure will also be watched during the test.  You will walk on a treadmill or use a stationary bike. If you cannot use these, you may be asked to turn a crank with your hands.  The activity will get harder and will raise your heart rate.  You may be asked to breathe into a tube a few times during the test. This measures the gases that you breathe out.  You will be asked how you are feeling throughout the test.  You will exercise until your heart reaches a target heart rate. You will stop early if: ? You feel dizzy. ? You have chest pain. ? You are out of breath. ?  Your blood pressure is too high or too low. ? You have an irregular heartbeat. ? You have pain or aching in your arms or legs. The procedure may vary among doctors and hospitals. What happens after the procedure?  Your blood pressure, heart rate, breathing rate, and blood oxygen level will be watched after the test.  You may return to your normal diet and activities as told by your doctor.  It is up to you to get the results of your test. Ask your doctor, or the department that is doing the test, when your results  will be ready. Summary  An exercise stress test is a test to check how your heart works during exercise.  This test is done to check for coronary artery disease.  Your heart rate will be watched while you are resting and while you are exercising.  Follow instructions from your doctor about what you cannot eat or drink before the test. This information is not intended to replace advice given to you by your health care provider. Make sure you discuss any questions you have with your health care provider. Document Released: 09/10/2007 Document Revised: 06/24/2016 Document Reviewed: 06/24/2016 Elsevier Interactive Patient Education  2019 [employer]

## 2018-08-05 NOTE — Telephone Encounter (Signed)
Patient questioned if it is safe to have her infected tooth extracted given her cardiac symptoms.  She states, it is just the root and will need to be cut out.  Today was the last day of her antibiotic.  She has not scheduled an appointment for extraction with [facility] dental yet.

## 2018-08-06 NOTE — Telephone Encounter (Signed)
Yes

## 2018-08-09 NOTE — Telephone Encounter (Signed)
Advised patient, verbalized understanding  

## 2018-08-11 ENCOUNTER — Ambulatory Visit (INDEPENDENT_AMBULATORY_CARE_PROVIDER_SITE_OTHER): Payer: Medicare Other

## 2018-08-11 DIAGNOSIS — R55 Syncope and collapse: Secondary | ICD-10-CM

## 2018-08-11 DIAGNOSIS — R002 Palpitations: Secondary | ICD-10-CM | POA: Diagnosis not present

## 2018-08-11 DIAGNOSIS — R0609 Other forms of dyspnea: Secondary | ICD-10-CM | POA: Diagnosis not present

## 2018-08-27 ENCOUNTER — Other Ambulatory Visit: Payer: Self-pay

## 2018-09-02 ENCOUNTER — Telehealth: Payer: Self-pay | Admitting: *Deleted

## 2018-09-02 ENCOUNTER — Other Ambulatory Visit: Payer: Self-pay | Admitting: *Deleted

## 2018-09-02 NOTE — Telephone Encounter (Signed)
Patient advised and scheduled for COVID 19 testing.

## 2018-09-02 NOTE — Telephone Encounter (Signed)
-----   Message from Karma Ganja sent at 09/02/2018  7:37 AM EDT ----- Regarding: RE: ETT?  Good morning ladies,  Juliette Alcide please call this patient and schedule for Covid testing on this Friday or Monday June 1 at the latest.  All GXT's must have Covid testing prior to performing GXT test.  Please inform the patient that they are to self isolate after the Covid testing until the GXT procedure is performed.  Marlowe Kays schedule this patient for GXT on June 4th at 10:30 am at [facility].  Thanks, Zella Ball  ----- Message ----- From: Sherrilyn Rist Sent: 09/01/2018   1:58 PM EDT To: Mikeal Hawthorne, LPN Subject: RE: ETT?                                       Need to check with Jerrell Belfast  ----- Message ----- From: Burnell Blanks, LPN Sent: 4/58/0998  11:13 AM EDT To: Sherrilyn Rist, Deedie C Creed Subject: RE: ETT?                                       Yes this does need to be scheduled ----- Message ----- From: Silver Huguenin Sent: 09/01/2018   8:39 AM EDT To: Oneal Deputy, LPN Subject: ETT?                                           Juliette Alcide does this need to be scheduled? ----- Message ----- From: Burnell Blanks, LPN Sent: 3/38/2505   2:47 PM EDT To: Oneal Deputy, LPN, #   ----- Message ----- From: Chilton Si, MD Sent: 08/25/2018   1:00 PM EDT To: Burnell Blanks, LPN  Yes she approved.  From what I remember she cc'd the scheduler on her reply. ----- Message ----- From: Burnell Blanks, LPN Sent: 3/97/6734   9:35 AM EDT To: Chilton Si, MD  Good morning, Just following up on to see if you spoke to Dr Delton See about this patient having an ETT. They are not scheduling any unless approved by her. If no ETT what would you recommend? Juliette Alcide

## 2018-09-03 ENCOUNTER — Other Ambulatory Visit (HOSPITAL_COMMUNITY): Payer: Medicare Other

## 2018-09-06 ENCOUNTER — Other Ambulatory Visit (HOSPITAL_COMMUNITY)
Admission: RE | Admit: 2018-09-06 | Discharge: 2018-09-06 | Disposition: A | Discharge status: Home / Self Care | Payer: Medicare Other | Source: Ambulatory Visit | Attending: Cardiology | Admitting: Cardiology

## 2018-09-06 DIAGNOSIS — Z1159 Encounter for screening for other viral diseases: Secondary | ICD-10-CM | POA: Diagnosis present

## 2018-09-08 ENCOUNTER — Telehealth (HOSPITAL_COMMUNITY): Payer: Self-pay | Admitting: *Deleted

## 2018-09-08 LAB — NOVEL CORONAVIRUS, NAA (HOSP ORDER, SEND-OUT TO REF LAB; TAT 18-24 HRS): SARS-CoV-2, NAA: NOT DETECTED

## 2018-09-08 NOTE — Telephone Encounter (Signed)
Close encounter 

## 2018-09-09 ENCOUNTER — Other Ambulatory Visit: Payer: Self-pay

## 2018-09-09 ENCOUNTER — Ambulatory Visit (HOSPITAL_COMMUNITY)
Admission: RE | Admit: 2018-09-09 | Discharge: 2018-09-09 | Disposition: A | Discharge status: Home / Self Care | Payer: Medicare Other | Source: Ambulatory Visit | Attending: Cardiology | Admitting: Cardiology

## 2018-09-09 DIAGNOSIS — R0609 Other forms of dyspnea: Secondary | ICD-10-CM | POA: Insufficient documentation

## 2018-09-09 DIAGNOSIS — R55 Syncope and collapse: Secondary | ICD-10-CM | POA: Diagnosis present

## 2018-09-09 DIAGNOSIS — R002 Palpitations: Secondary | ICD-10-CM | POA: Diagnosis present

## 2018-09-09 LAB — EXERCISE TOLERANCE TEST
Estimated workload: 7.5 METS
Exercise duration (min): 6 min
Exercise duration (sec): 20 s
MPHR: 158 {beats}/min
Peak HR: 144 {beats}/min
Percent HR: 91 %
Rest HR: 77 {beats}/min

## 2019-06-07 NOTE — Telephone Encounter (Signed)
Reason for Disposition  ??? [1] CLOSE CONTACT COVID-19 EXPOSURE within last 14 days AND [2] NO symptoms    Answer Assessment - Initial Assessment Questions  1. COVID-19 CLOSE CONTACT: "Who is the person with the confirmed or suspected COVID-19 infection that you were exposed to?"      Her granddaughter    2. PLACE of CONTACT: "Where were you when you were exposed to COVID-19?" (e.g., home, school, medical waiting room; which city?)      [place], [place] within home    3. TYPE of CONTACT: "How much contact was there?" (e.g., sitting next to, live in same house, work in same office, same building)      Same house, hugged    4. DURATION of CONTACT: "How long were you in contact with the COVID-19 patient?" (e.g., a few seconds, passed by person, a few minutes, 15 minutes or longer, live with the patient)      Only hugged her really quick- stayed outside of that stayed 6 feet apart    5. MASK: "Were you wearing a mask?" "Was the other person wearing a mask?" Note: wearing a mask reduces the risk of an otherwise close contact.      Grand daughter was not wearing a mask, patient was wearing mask    6. DATE of CONTACT: "When did you have contact with a COVID-19 patient?" (e.g., how many days ago)      2 days ago    7. COMMUNITY SPREAD: "Are there lots of cases of COVID-19 (community spread) where you live?" (See public health department website, if unsure)        Doesn't think COVID is high in the area    8. SYMPTOMS: "Do you have any symptoms?" (e.g., fever, cough, breathing difficulty, loss of taste or smell)      Denies any current symptoms    9. PREGNANCY OR POSTPARTUM: "Is there any chance you are pregnant?" "When was your last menstrual period?" "Did you deliver in the last 2 weeks?"     N/a    10. HIGH RISK: "Do you have any heart or lung problems? Do you have a weak immune system?" (e.g., heart failure, COPD, asthma, HIV positive, chemotherapy, renal failure, diabetes mellitus, sickle cell anemia, obesity)         HTN, high cholesterol    11. TRAVEL: "Have you traveled out of the country recently?" If so, "When and where?" Also ask about out-of-state travel, since the CDC has identified some high-risk cities for community spread in the [place] Note: Travel becomes less relevant if there is widespread community transmission where the patient lives.        Denies any recent travel    Protocols used: CORONAVIRUS (COVID-19) [facility]    Call received on [facility] COVID hotline.     Brief description of triage: See above note    Triage indicates for patient to: See disposition    Patient reports she already has spoke with PCP about exposure and is scheduled for COVID testing this coming Monday.     Care advice provided. Recommended using local department of health website for testing locations and up to date information. Caller verbalizes understanding, denies any other questions or concerns; instructed to call back for any new or worsening symptoms.

## 2019-06-08 NOTE — Telephone Encounter (Signed)
Reason for Disposition  ??? COVID-19 vaccine, Frequently Asked Questions (FAQs)  ??? [1] CLOSE CONTACT COVID-19 EXPOSURE within last 14 days AND [2] NO symptoms    Answer Assessment - Initial Assessment Questions  1. MAIN CONCERN OR SYMPTOM:  "What is your main concern right now?" "What question do you have?" "What's the main symptom you're worried about?" (e.g., fever, pain, redness, swelling)      See note.    2. VACCINE: "What vaccination did you receive?" "Is this your first or second shot?" (e.g., none; Moderna, Pfizer, other)      N/A    3. SYMPTOM ONSET: "When did the N/A begin?" (e.g., not relevant; hours, days)       N/A    4. SYMPTOM SEVERITY: "How bad is it?"       N/A    5. FEVER: "Is there a fever?" If so, ask: "What is it, how was it measured, and when did it start?"       N/A    6. PAST REACTIONS: "Have you reacted to immunizations before?" If so, ask: "What happened?"      N/A    7. OTHER SYMPTOMS: "Do you have any other symptoms?"      N/A    Protocols used: CORONAVIRUS (COVID-19) VACCINE QUESTIONS AND REACTIONS-ADULT-AH, CORONAVIRUS (COVID-19) EXPOSURE-ADULT-AH    Call received on [facility] COVID hotline.     Brief description of triage: wanted to know if she could get the vaccine while quarantining after COVID exposure.     Triage not needed.    Care advice provided. Recommended using local department of health website for testing locations and up to date information. Caller verbalizes understanding, denies any other questions or concerns; instructed to call back for any new or worsening symptoms.    Unable to route to PCP.

## 2019-06-14 NOTE — Telephone Encounter (Signed)
Call received on [facility] COVID hotline.     Brief description of triage: Tested + for covid on Saturday 3/6.  Questions about restrictions around the house and how long she should be in quarantine.     Caller did state she was told to quarantine for 10 days after she tested + for covid by another provider.  Writer did encourage to follow the advice that has been given to her.     Care advice provided. Recommended using local department of health website for testing locations and up to date information. Caller verbalizes understanding, denies any other questions or concerns; instructed to call back for any new or worsening symptoms.      Reason for Disposition  ??? General information question, no triage required and triager able to answer question    Answer Assessment - Initial Assessment Questions  1. REASON FOR CALL or QUESTION: "What is your reason for calling today?" or "How can I best help you?" or "What question do you have that I can help answer?"      Tested + for covid and she was asking if she can do things around the house.    Protocols used: INFORMATION ONLY CALL - NO TRIAGE-ADULT-AH

## 2019-06-16 NOTE — Telephone Encounter (Addendum)
Reason for Disposition  ??? [1] COVID-19 infection suspected by caller or triager AND [2] mild symptoms (cough, fever, or others) AND [2] no complications or SOB    Answer Assessment - Initial Assessment Questions  1. COVID-19 DIAGNOSIS: "Who made your Coronavirus (COVID-19) diagnosis?" "Was it confirmed by a positive lab test?" If not diagnosed by a HCP, ask "Are there lots of cases (community spread) where you live?" (See public health department website, if unsure)      [facility]    2. COVID-19 EXPOSURE: "Was there any known exposure to COVID before the symptoms began?" CDC Definition of close contact: within 6 feet (2 meters) for a total of 15 minutes or more over a 24-hour period.       Yes    3. ONSET: "When did the COVID-19 symptoms start?"       Saturday    4. WORST SYMPTOM: "What is your worst symptom?" (e.g., cough, fever, shortness of breath, muscle aches)      Weakness which began, yesterday    5. COUGH: "Do you have a cough?" If so, ask: "How bad is the cough?"        No    6. FEVER: "Do you have a fever?" If so, ask: "What is your temperature, how was it measured, and when did it start?"      Not any more    7. RESPIRATORY STATUS: "Describe your breathing?" (e.g., shortness of breath, wheezing, unable to speak)       No trouble    8. BETTER-SAME-WORSE: "Are you getting better, staying the same or getting worse compared to yesterday?"  If getting worse, ask, "In what way?"      Weakness    9. HIGH RISK DISEASE: "Do you have any chronic medical problems?" (e.g., asthma, heart or lung disease, weak immune system, obesity, etc.)      HTN    10. PREGNANCY: "Is there any chance you are pregnant?" "When was your last menstrual period?"        No    11. OTHER SYMPTOMS: "Do you have any other symptoms?"  (e.g., chills, fatigue, headache, loss of smell or taste, muscle pain, sore throat; new loss of smell or taste especially support the diagnosis of COVID-19)        Loss of taste and smell, severe weakness     Protocols used: CORONAVIRUS (COVID-19) DIAGNOSED OR SUSPECTED-ADULT-AH    Call received on [facility].     Brief description of triage: tested + for Covid on Saturday,  Yesterday weakness and fatigue set in.    Triage indicates for patient to Call PCP when office open    Care advice provided. Recommended using local department of health website for testing locations and up to date information. Caller verbalizes understanding, denies any other questions or concerns; instructed to call back for any new or worsening symptoms.      Information given to pt regarding how long to wait to get vaccine after having Covid 19:    Vaccination should be offered to persons regardless of history of prior symptomatic or asymptomatic SARS-CoV-2 infection. Vaccination of persons with known current SARS-CoV-2 infection should be deferred until the person has recovered from the acute illness (if the person had symptoms) and criteria have been met for them to discontinue isolation. Persons with documented acute SARS-CoV-2 infection in the preceding 90 days may delay vaccination until near the end of this period, if desired, because current evidence suggests reinfection is  uncommon during this time. Viral testing to assess for acute SARS-CoV-2 infection or serologic testing to assess for prior infection solely for the purpose of vaccine decision-making is not recommended

## 2019-06-19 NOTE — Telephone Encounter (Signed)
Reason for Disposition  ??? Health Information question, no triage required and triager able to answer question    Answer Assessment - Initial Assessment Questions  1. REASON FOR CALL or QUESTION: "What is your reason for calling today?" or "How can I best help you?" or "What question do you have that I can help answer?"      General information about COVID    Protocols used: INFORMATION ONLY CALL - NO TRIAGE-ADULT-AH    Call received on [facility] COVID hotline.     Brief description of triage: Caller had general COVID question about isolation.    Triage indicates for patient to Home Care Information only. Directed to [employer] site.     Care advice provided. Recommended using local department of health website for testing locations and up to date information. Caller verbalizes understanding, denies any other questions or concerns; instructed to call back for any new or worsening symptoms.

## 2019-10-17 ENCOUNTER — Ambulatory Visit: Payer: Medicare Other | Admitting: Cardiovascular Disease

## 2020-02-06 ENCOUNTER — Other Ambulatory Visit: Payer: Self-pay | Admitting: Physician Assistant

## 2020-02-06 DIAGNOSIS — Z1231 Encounter for screening mammogram for malignant neoplasm of breast: Secondary | ICD-10-CM

## 2020-02-09 ENCOUNTER — Other Ambulatory Visit: Payer: Self-pay

## 2020-02-09 ENCOUNTER — Ambulatory Visit
Admission: RE | Admit: 2020-02-09 | Discharge: 2020-02-09 | Disposition: A | Discharge status: Home / Self Care | Payer: Medicare Other | Source: Ambulatory Visit | Attending: Physician Assistant | Admitting: Physician Assistant

## 2020-02-09 DIAGNOSIS — Z1231 Encounter for screening mammogram for malignant neoplasm of breast: Secondary | ICD-10-CM

## 2020-02-14 ENCOUNTER — Other Ambulatory Visit: Payer: Self-pay

## 2020-02-14 ENCOUNTER — Ambulatory Visit (INDEPENDENT_AMBULATORY_CARE_PROVIDER_SITE_OTHER): Payer: Medicare Other | Admitting: Ophthalmology

## 2020-02-14 ENCOUNTER — Encounter (INDEPENDENT_AMBULATORY_CARE_PROVIDER_SITE_OTHER): Payer: Self-pay | Admitting: Ophthalmology

## 2020-02-14 DIAGNOSIS — H2511 Age-related nuclear cataract, right eye: Secondary | ICD-10-CM | POA: Diagnosis not present

## 2020-02-14 DIAGNOSIS — H43821 Vitreomacular adhesion, right eye: Secondary | ICD-10-CM

## 2020-02-14 DIAGNOSIS — H43822 Vitreomacular adhesion, left eye: Secondary | ICD-10-CM | POA: Diagnosis not present

## 2020-02-14 DIAGNOSIS — E119 Type 2 diabetes mellitus without complications: Secondary | ICD-10-CM | POA: Diagnosis not present

## 2020-02-14 DIAGNOSIS — H2512 Age-related nuclear cataract, left eye: Secondary | ICD-10-CM | POA: Insufficient documentation

## 2020-02-14 NOTE — Progress Notes (Signed)
02/14/2020     CHIEF COMPLAINT Patient presents for Diabetic Eye Exam   HISTORY OF PRESENT ILLNESS: Christie Cervantes is a 64 y.o. female who presents to the clinic today for:   HPI    Diabetic Eye Exam    Vision is blurred for near and is worsening.  Associated Symptoms Negative for Flashes and Floaters.  Diabetes characteristics include Type 2.  Blood sugar level is controlled.  Last A1C 5.7.  I, the attending physician,  performed the HPI with the patient and updated documentation appropriately.          Comments    2 Year Diabetic Exam OU. OCT  Pt c/o NVA being blurry. Pt has been using reading gls. Pt wants a note to go to Dr. Laverta [place] BGL: does not regularly check A1C: 5.7       Last edited by Tilda Franco on 02/14/2020  3:32 PM. (History)      Referring physician: Shanon Rosser, PA-C [place],  [place] [id number]  HISTORICAL INFORMATION:   Selected notes from the [facility]: No current outpatient medications on file. (Ophthalmic Drugs)   No current facility-administered medications for this visit. (Ophthalmic Drugs)   Current Outpatient Medications (Other)  Medication Sig  . amitriptyline (ELAVIL) 50 MG tablet Take 50-100 mg by mouth at bedtime.    Marland Kitchen atorvastatin (LIPITOR) 20 MG tablet Take 20 mg by mouth daily.  . clonazePAM (KLONOPIN) 0.5 MG tablet Take 0.5 mg by mouth at bedtime.    Marland Kitchen FLUoxetine (PROZAC) 20 MG capsule Take 20 mg by mouth daily.  . hydrochlorothiazide (HYDRODIURIL) 25 MG tablet Take 25 mg by mouth every morning.    Marland Kitchen HYDROcodone-acetaminophen (NORCO) 7.5-325 MG tablet hydrocodone 7.5 mg-acetaminophen 325 mg tablet  . levothyroxine (SYNTHROID) 75 MCG tablet   . morphine (MS CONTIN) 60 MG 12 hr tablet Take 60 mg by mouth 2 (two) times daily.  . penicillin v potassium (VEETID) 500 MG tablet Take 1 tablet (500 mg total) by mouth 3 (three) times daily.   Current Facility-Administered  Medications (Other)  Medication Route  . 0.9 %  sodium chloride infusion Intravenous      REVIEW OF SYSTEMS: ROS    Positive for: Endocrine   Last edited by Tilda Franco on 02/14/2020  3:32 PM. (History)       ALLERGIES Allergies  Allergen Reactions  . Compazine [Prochlorperazine Maleate] Other (See Comments)    Abnormal Behavior    PAST MEDICAL HISTORY Past Medical History:  Diagnosis Date  . DDD (degenerative disc disease)    back  . Depression   . Hypertension   . Migraine headache   . Tinnitus    Past Surgical History:  Procedure Laterality Date  . ABDOMINAL HYSTERECTOMY    . APPENDECTOMY    . CARPAL TUNNEL RELEASE     pt denies surgery  . UTERINE FIBROID SURGERY     x2    FAMILY HISTORY Family History  Problem Relation Age of Onset  . Multiple myeloma Mother 62  . Diabetes Sister   . Hypercalcemia Brother   . Hypertension Brother   . Diabetes Sister   . Colon cancer Neg Hx     SOCIAL HISTORY Social History   Tobacco Use  . Smoking status: Current Every Day Smoker    Packs/day: 0.50    Years: 20.00    Pack years: 10.00    Types:  Cigarettes  . Smokeless tobacco: Never Used  Substance Use Topics  . Alcohol use: No  . Drug use: No         OPHTHALMIC EXAM: Base Eye Exam    Visual Acuity (Snellen - Linear)      Right Left   Dist [place] 20/30 -1 20/50 -2   Dist ph [place] 20/30 + 20/25 +       Tonometry (Tonopen, 3:39 PM)      Right Left   Pressure 12 13       Pupils      Pupils Dark Light Shape React APD   Right PERRL 5 3 Round Brisk None   Left PERRL 5 3 Round Brisk None       Neuro/Psych    Oriented x3: Yes   Mood/Affect: Normal       Dilation    Both eyes: 1.0% Mydriacyl, 2.5% Phenylephrine @ 3:39 PM        Slit Lamp and Fundus Exam    External Exam      Right Left   External Normal Normal       Slit Lamp Exam      Right Left   Lids/Lashes Normal Normal   Conjunctiva/Sclera White and quiet White and quiet    Cornea Clear Clear   Anterior Chamber Deep and quiet Deep and quiet   Iris Round and reactive Round and reactive   Lens Trace Nuclear sclerosis Trace Nuclear sclerosis   Anterior Vitreous Normal Normal       Fundus Exam      Right Left   Posterior Vitreous Normal Normal   Disc Normal Normal   C/D Ratio 0.35 0.4   Macula Normal Normal   Vessels no DR no DR   Periphery Normal Normal          IMAGING AND PROCEDURES  Imaging and Procedures for 02/14/20  OCT, Retina - OU - Both Eyes       Right Eye Quality was good. Scan locations included subfoveal. Central Foveal Thickness: 255. Progression has been stable. Findings include vitreomacular adhesion .   Left Eye Quality was good. Scan locations included subfoveal. Central Foveal Thickness: 253. Progression has been stable.   Notes Incidental vitreal macular adhesion right eye, prefoveal floater in the left eye could signify partial PVD                ASSESSMENT/PLAN:  Diabetes mellitus without complication (HCC) No detectable diabetic retinopathy OU  Nuclear sclerotic cataract of right eye The nature of cataract was discussed with the patient as well as the elective nature of surgery. The patient was reassured that surgery at a later date does not put the patient at risk for a worse outcome. It was emphasized that the need for surgery is dictated by the patient's quality of life as influenced by the cataract. Patient was instructed to maintain close follow up with their general eye care doctor. Minimal OU, age-related  Vitreomacular adhesion of right eye Incidentally noted, no active disease      ICD-10-CM   1. Vitreomacular adhesion of left eye  H43.822 OCT, Retina - OU - Both Eyes  2. Vitreomacular adhesion of right eye  H43.821 OCT, Retina - OU - Both Eyes  3. Diabetes mellitus without complication ([place])  H82.9   4. Nuclear sclerotic cataract of right eye  H25.11   5. Nuclear sclerotic cataract of left eye   H25.12     1. Patient understands critical  importance of continued blood sugar control and monitoring  2. No diabetic retinopathy detected in either eye 3. Visually insignificant trace early age-related cataract OU  Ophthalmic Meds Ordered this visit:  No orders of the defined types were placed in this encounter.      Return in about 1 year (around 02/13/2021) for DILATE OU, OCT.  There are no Patient Instructions on file for this visit.   Explained the diagnoses, plan, and follow up with the patient and they expressed understanding.  Patient expressed understanding of the importance of proper follow up care.   Clent Demark Riccardo Holeman M.D. Diseases & Surgery of the Retina and Vitreous [facility] [place] 02/14/20     Abbreviations: M myopia (nearsighted); A astigmatism; H hyperopia (farsighted); P presbyopia; Mrx spectacle prescription;  CTL contact lenses; OD right eye; OS left eye; OU both eyes  XT exotropia; ET esotropia; PEK punctate epithelial keratitis; PEE punctate epithelial erosions; DES dry eye syndrome; MGD meibomian gland dysfunction; ATs artificial tears; PFAT's preservative free artificial tears; [place] nuclear sclerotic cataract; PSC posterior subcapsular cataract; ERM epi-retinal membrane; PVD posterior vitreous detachment; RD retinal detachment; DM diabetes mellitus; DR diabetic retinopathy; NPDR non-proliferative diabetic retinopathy; PDR proliferative diabetic retinopathy; CSME clinically significant macular edema; DME diabetic macular edema; dbh dot blot hemorrhages; CWS cotton wool spot; POAG primary open angle glaucoma; C/D cup-to-disc ratio; HVF humphrey visual field; GVF goldmann visual field; OCT optical coherence tomography; IOP intraocular pressure; BRVO Branch retinal vein occlusion; CRVO central retinal vein occlusion; CRAO central retinal artery occlusion; BRAO branch retinal artery occlusion; RT retinal tear; SB scleral buckle; PPV pars plana vitrectomy; VH  Vitreous hemorrhage; PRP panretinal laser photocoagulation; IVK intravitreal kenalog; VMT vitreomacular traction; MH Macular hole;  NVD neovascularization of the disc; NVE neovascularization elsewhere; AREDS age related eye disease study; ARMD age related macular degeneration; POAG primary open angle glaucoma; EBMD epithelial/anterior basement membrane dystrophy; ACIOL anterior chamber intraocular lens; IOL intraocular lens; PCIOL posterior chamber intraocular lens; Phaco/IOL phacoemulsification with intraocular lens placement; [place] photorefractive keratectomy; LASIK laser assisted in situ keratomileusis; HTN hypertension; DM diabetes mellitus; COPD chronic obstructive pulmonary disease

## 2020-02-14 NOTE — Patient Instructions (Signed)
Follow-up with Dr. Aura Camps of [facility] eye care for refractive needs

## 2020-02-14 NOTE — Assessment & Plan Note (Signed)
No detectable diabetic retinopathy OU 

## 2020-02-14 NOTE — Assessment & Plan Note (Signed)
The nature of cataract was discussed with the patient as well as the elective nature of surgery. The patient was reassured that surgery at a later date does not put the patient at risk for a worse outcome. It was emphasized that the need for surgery is dictated by the patient's quality of life as influenced by the cataract. Patient was instructed to maintain close follow up with their general eye care doctor. Minimal OU, age-related

## 2020-02-14 NOTE — Assessment & Plan Note (Signed)
Incidentally noted, no active disease

## 2020-03-22 ENCOUNTER — Ambulatory Visit: Payer: Medicare Other | Admitting: Gastroenterology

## 2020-04-10 ENCOUNTER — Ambulatory Visit: Payer: Medicare Other | Admitting: Gastroenterology

## 2020-04-11 ENCOUNTER — Telehealth: Payer: Self-pay | Admitting: Internal Medicine

## 2020-04-11 ENCOUNTER — Ambulatory Visit: Payer: Medicare Other | Admitting: Gastroenterology

## 2020-04-11 NOTE — Telephone Encounter (Signed)
patient was 23 minutes late to her appt. Per, Maya patient needed to reschedule. Patient upset that we cannot see her today. She states that she had a positive cologuard and is wanting to know if it is ok to wait until her appt on 04/24/20. Patient was also upset that she couldn't get in with Dr. Leone Payor for a couple of months.

## 2020-04-11 NOTE — Telephone Encounter (Signed)
Inbound call from patient returning your call. 

## 2020-04-11 NOTE — Telephone Encounter (Signed)
Left message for patient to call back  

## 2020-04-13 NOTE — Telephone Encounter (Signed)
Patient does not need OV since she is positive cologuard.  She will come in for pre-visit and colonoscopy direct.  She has constipation, but this is due to chronic pain medications.

## 2020-04-17 ENCOUNTER — Encounter: Payer: Self-pay | Admitting: Internal Medicine

## 2020-04-17 ENCOUNTER — Other Ambulatory Visit: Payer: Self-pay

## 2020-04-17 ENCOUNTER — Ambulatory Visit (AMBULATORY_SURGERY_CENTER): Payer: Self-pay

## 2020-04-17 VITALS — Ht 67.0 in | Wt 153.6 lb

## 2020-04-17 DIAGNOSIS — R195 Other fecal abnormalities: Secondary | ICD-10-CM

## 2020-04-17 NOTE — Progress Notes (Signed)
No egg or soy allergy known to patient  No issues with past sedation with any surgeries or procedures No intubation problems in the past  No FH of Malignant Hyperthermia No diet pills per patient No home 02 use per patient  No blood thinners per patient  Pt denies issues with constipation  No A fib or A flutter  EMMI video to pt or via MyChart  COVID 19 guidelines implemented in [place] today with Pt and RN  Pt is fully vaccinated  for Covid x2;  Due to the COVID-19 pandemic we are asking patients to follow certain guidelines.  Pt aware of COVID protocols and [facility] guidelines

## 2020-04-23 ENCOUNTER — Encounter: Payer: Medicare Other | Admitting: Internal Medicine

## 2020-04-24 ENCOUNTER — Ambulatory Visit: Payer: Medicare Other | Admitting: Gastroenterology

## 2020-06-21 ENCOUNTER — Telehealth: Payer: Self-pay | Admitting: *Deleted

## 2020-06-21 ENCOUNTER — Encounter: Payer: Medicare Other | Admitting: Internal Medicine

## 2020-06-21 ENCOUNTER — Telehealth: Payer: Self-pay | Admitting: Gastroenterology

## 2020-06-21 NOTE — Telephone Encounter (Signed)
Pt rescheduled for 06-25-20 at 3:00 pm- she cancelled today d/t poor prep today.    Dr. Leone Payor has given customized instructions for prep-  Have patient do following  Get Linzess samples and take 290 ug daily AM Fri, Sat Sun  Stay on a soft low residue diet until prep day  Take 4 doses MiraLAx in 1 hour Sat late afternoon, evening  Do a sample prep but NOT SuTab  If cannot get a sample Rx Plenvu or Suprep or generic Moviprep but be sure she will get generic Moviprep  I spoke with pt and asked her to come into office to pick up samples and to go over new prep instructions.  Understanding voiced and pt will pick up samples and new instructions on Friday, early am  I provided patient with information on a soft low residue diet for her to follow until Sunday

## 2020-06-21 NOTE — Telephone Encounter (Signed)
Oncall Note  Patient took Dulcolax and drank the miralax prep as instructed, called to report that she is still having solid stool this am at AM. Advised her to drink additional 4-8 cups of Miralax until 5PM and come in if she starts having more liquid BM if not may need to reschedule for later in the day or a different day.  Please follow up

## 2020-06-21 NOTE — Telephone Encounter (Signed)
Have patient do following  Get Linzess samples and take 290 ug daily AM Fri, Sat Sun  Stay on a soft low residue diet until prep day  Take 4 doses MiraLAx in 1 hour Sat late afternoon, evening  Do a sample prep but NOT SuTab  If cannot get a sample Rx Plenvu or Suprep or generic Moviprep but be sure she will get generic Moviprep

## 2020-06-25 ENCOUNTER — Ambulatory Visit (AMBULATORY_SURGERY_CENTER): Payer: Medicare Other | Admitting: Internal Medicine

## 2020-06-25 ENCOUNTER — Encounter: Payer: Self-pay | Admitting: Internal Medicine

## 2020-06-25 ENCOUNTER — Other Ambulatory Visit: Payer: Self-pay

## 2020-06-25 VITALS — BP 127/79 | HR 78 | Temp 97.3°F | Resp 13 | Ht 67.0 in | Wt 153.0 lb

## 2020-06-25 DIAGNOSIS — R195 Other fecal abnormalities: Secondary | ICD-10-CM

## 2020-06-25 DIAGNOSIS — K5903 Drug induced constipation: Secondary | ICD-10-CM

## 2020-06-25 DIAGNOSIS — K5520 Angiodysplasia of colon without hemorrhage: Secondary | ICD-10-CM | POA: Diagnosis not present

## 2020-06-25 DIAGNOSIS — T402X5A Adverse effect of other opioids, initial encounter: Secondary | ICD-10-CM

## 2020-06-25 HISTORY — DX: Angiodysplasia of colon without hemorrhage: K55.20

## 2020-06-25 HISTORY — DX: Adverse effect of other opioids, initial encounter: T40.2X5A

## 2020-06-25 HISTORY — DX: Drug induced constipation: K59.03

## 2020-06-25 MED ORDER — LUBIPROSTONE 24 MCG PO CAPS: 24.0000 ug | ORAL_CAPSULE | Freq: Two times a day (BID) | ORAL | 11 refills | Status: DC

## 2020-06-25 MED ORDER — SODIUM CHLORIDE 0.9 % IV SOLN: 500.0000 mL | Freq: Once | INTRAVENOUS | Status: DC

## 2020-06-25 NOTE — Progress Notes (Signed)
Pt's states no medical or surgical changes since previsit or office visit. VS done by CW.

## 2020-06-25 NOTE — Patient Instructions (Addendum)
There ius a lesion called angiodysplasia or AVM - blood vessels on surface of colon lining. In the photos it is a red spot. These can leak blood and I think that is why the Cologuard was abnormal.  They usually do not cause problems but if they bleed they can be cauterized.  Do not do any more stool tests but let's repeat a colonoscopy in 10 years.  I have started lubiprostone (Amitiza) to help you move bowels better - it is a medication that can help in your situation with pain medicatrion contributing to constipation. You may also take MiraLax but I think this will work better. Take it with food to reduce chance of nausea.  I appreciate the opportunity to care for you. Iva Boop, MD, [facility]  Be sure to take your new medication as directed. ReaD all of the handouts given to you.  YOU HAD AN ENDOSCOPIC PROCEDURE TODAY AT THE [facility]:   Refer to the procedure report that was given to you for any specific questions about what was found during the examination.  If the procedure report does not answer your questions, please call your gastroenterologist to clarify.  If you requested that your care partner not be given the details of your procedure findings, then the procedure report has been included in a sealed envelope for you to review at your convenience later.  YOU SHOULD EXPECT: Some feelings of bloating in the abdomen. Passage of more gas than usual.  Walking can help get rid of the air that was put into your GI tract during the procedure and reduce the bloating. If you had a lower endoscopy (such as a colonoscopy or flexible sigmoidoscopy) you may notice spotting of blood in your stool or on the toilet paper. If you underwent a bowel prep for your procedure, you may not have a normal bowel movement for a few days.  Please Note:  You might notice some irritation and congestion in your nose or some drainage.  This is from the oxygen used during your procedure.  There is no need  for concern and it should clear up in a day or so.  SYMPTOMS TO REPORT IMMEDIATELY:   Following lower endoscopy (colonoscopy or flexible sigmoidoscopy):  Excessive amounts of blood in the stool  Significant tenderness or worsening of abdominal pains  Swelling of the abdomen that is new, acute  Fever of 100F or higher   For urgent or emergent issues, a gastroenterologist can be reached at any hour by calling [phone]. Do not use MyChart messaging for urgent concerns.    DIET:  We do recommend a small meal at first, but then you may proceed to your regular diet.  Drink plenty of fluids but you should avoid alcoholic beverages for 24 hours.  ACTIVITY:  You should plan to take it easy for the rest of today and you should NOT DRIVE or use heavy machinery until tomorrow (because of the sedation medicines used during the test).    FOLLOW UP: Our staff will call the number listed on your records 48-72 hours following your procedure to check on you and address any questions or concerns that you may have regarding the information given to you following your procedure. If we do not reach you, we will leave a message.  We will attempt to reach you two times.  During this call, we will ask if you have developed any symptoms of COVID 19. If you develop any symptoms (ie: fever,  flu-like symptoms, shortness of breath, cough etc.) before then, please call [phone].  If you test positive for Covid 19 in the 2 weeks post procedure, please call and report this information to [place].    If any biopsies were taken you will be contacted by phone or by letter within the next 1-3 weeks.  Please call us at [phone] if you have not heard about the biopsies in 3 weeks.    SIGNATURES/CONFIDENTIALITY: You and/or your care partner have signed paperwork which will be entered into your electronic medical record.  These signatures attest to the fact that that the information above on your After Visit Summary  has been reviewed and is understood.  Full responsibility of the confidentiality of this discharge information lies with you and/or your care-partner.

## 2020-06-25 NOTE — Progress Notes (Signed)
CBG 65 upon recovery room arrival.  D5W started.  15 minutes later CBG 71.  Pt given graham crackers and peanut butter before going home.  Pt is asymptomatic.

## 2020-06-25 NOTE — Op Note (Signed)
[facility] Patient Name: Christie Cervantes Procedure Date: 06/25/2020 3:14 PM MRN: [id number] Endoscopist: Iva Boop , MD Age: 65 Referring MD:  Date of Birth: 1955/11/18 Gender: Female Account #: [id number] Procedure:                Colonoscopy Indications:              Positive Cologuard test Medicines:                Propofol per Anesthesia, Monitored Anesthesia Care Procedure:                Pre-Anesthesia Assessment:                           - Prior to the procedure, a History and Physical                            was performed, and patient medications and                            allergies were reviewed. The patient's tolerance of                            previous anesthesia was also reviewed. The risks                            and benefits of the procedure and the sedation                            options and risks were discussed with the patient.                            All questions were answered, and informed consent                            was obtained. Prior Anticoagulants: The patient has                            taken no previous anticoagulant or antiplatelet                            agents. ASA Grade Assessment: II - A patient with                            mild systemic disease. After reviewing the risks                            and benefits, the patient was deemed in                            satisfactory condition to undergo the procedure.                           After obtaining informed consent, the colonoscope  was passed under direct vision. Throughout the                            procedure, the patient's blood pressure, pulse, and                            oxygen saturations were monitored continuously. The                            Olympus PCF-H190DL (#[id number]) Colonoscope was                            introduced through the anus and advanced to the the                            cecum,  identified by appendiceal orifice and                            ileocecal valve. The colonoscopy was somewhat                            difficult due to restricted mobility of the colon,                            significant looping and a tortuous colon.                            Successful completion of the procedure was aided by                            changing the patient to a supine position and                            applying abdominal pressure. The patient tolerated                            the procedure well. The quality of the bowel                            preparation was excellent. The bowel preparation                            used was Miralax + Plenvu double prep via split                            dose instruction. The ileocecal valve, appendiceal                            orifice, and rectum were photographed. Scope In: 3:20:14 PM Scope Out: 3:43:23 PM Scope Withdrawal Time: 0 hours 10 minutes 29 seconds  Total Procedure Duration: 0 hours 23 minutes 9 seconds  Findings:                 The perianal and digital rectal examinations  were                            normal.                           A single small angiodysplastic lesion without                            bleeding was found in the transverse colon.                           The exam was otherwise without abnormality on                            direct and retroflexion views. Complications:            No immediate complications. Estimated Blood Loss:     Estimated blood loss: none. Impression:               - A single non-bleeding colonic angiodysplastic                            lesion.                           - The examination was otherwise normal on direct                            and retroflexion views.                           - No specimens collected. Recommendation:           - Patient has a contact number available for                            emergencies. The signs and  symptoms of potential                            delayed complications were discussed with the                            patient. Return to normal activities tomorrow.                            Written discharge instructions were provided to the                            patient.                           - Resume previous diet.                           - Continue present medications.                           - Repeat colonoscopy in  10 years for screening                            purposes.                           - Start lunbiprostone 24 ug bid for opioid-induced                            constipation on top of chronic constipation                           No more routine stool tests as false + likely w/                            angiodysplasia ( suspect blood from this caued +                            Cologuard) Iva Boop, MD 06/25/2020 3:56:23 PM This report has been signed electronically.

## 2020-06-25 NOTE — Progress Notes (Signed)
PT taken to PACU. Monitors in place. VSS. Report given to RN. 

## 2020-06-27 ENCOUNTER — Telehealth: Payer: Self-pay

## 2020-06-27 NOTE — Telephone Encounter (Signed)
LVM

## 2020-07-10 ENCOUNTER — Ambulatory Visit: Payer: Medicare Other | Admitting: Cardiovascular Disease

## 2020-11-08 ENCOUNTER — Other Ambulatory Visit: Payer: Self-pay | Admitting: Orthopedic Surgery

## 2020-11-21 ENCOUNTER — Other Ambulatory Visit: Payer: Self-pay

## 2020-11-21 ENCOUNTER — Encounter (HOSPITAL_BASED_OUTPATIENT_CLINIC_OR_DEPARTMENT_OTHER): Payer: Self-pay | Admitting: Orthopedic Surgery

## 2020-11-28 ENCOUNTER — Encounter (HOSPITAL_BASED_OUTPATIENT_CLINIC_OR_DEPARTMENT_OTHER)
Admission: RE | Admit: 2020-11-28 | Discharge: 2020-11-28 | Disposition: A | Discharge status: Home / Self Care | Payer: Medicare Other | Source: Ambulatory Visit | Attending: Orthopedic Surgery | Admitting: Orthopedic Surgery

## 2020-11-28 DIAGNOSIS — Z7984 Long term (current) use of oral hypoglycemic drugs: Secondary | ICD-10-CM | POA: Diagnosis not present

## 2020-11-28 DIAGNOSIS — Z888 Allergy status to other drugs, medicaments and biological substances status: Secondary | ICD-10-CM | POA: Diagnosis not present

## 2020-11-28 DIAGNOSIS — Z8249 Family history of ischemic heart disease and other diseases of the circulatory system: Secondary | ICD-10-CM | POA: Diagnosis not present

## 2020-11-28 DIAGNOSIS — E119 Type 2 diabetes mellitus without complications: Secondary | ICD-10-CM | POA: Diagnosis not present

## 2020-11-28 DIAGNOSIS — Z79891 Long term (current) use of opiate analgesic: Secondary | ICD-10-CM | POA: Diagnosis not present

## 2020-11-28 DIAGNOSIS — Z79899 Other long term (current) drug therapy: Secondary | ICD-10-CM | POA: Diagnosis not present

## 2020-11-28 DIAGNOSIS — Z833 Family history of diabetes mellitus: Secondary | ICD-10-CM | POA: Diagnosis not present

## 2020-11-28 DIAGNOSIS — Z01812 Encounter for preprocedural laboratory examination: Secondary | ICD-10-CM | POA: Diagnosis not present

## 2020-11-28 DIAGNOSIS — F17210 Nicotine dependence, cigarettes, uncomplicated: Secondary | ICD-10-CM | POA: Diagnosis not present

## 2020-11-28 DIAGNOSIS — Z8371 Family history of colonic polyps: Secondary | ICD-10-CM | POA: Diagnosis not present

## 2020-11-28 DIAGNOSIS — G5601 Carpal tunnel syndrome, right upper limb: Secondary | ICD-10-CM | POA: Diagnosis present

## 2020-11-28 DIAGNOSIS — Z808 Family history of malignant neoplasm of other organs or systems: Secondary | ICD-10-CM | POA: Diagnosis not present

## 2020-11-28 LAB — BASIC METABOLIC PANEL
Anion gap: 5 (ref 5–15)
BUN: 7 mg/dL — ABNORMAL LOW (ref 8–23)
CO2: 29 mmol/L (ref 22–32)
Calcium: 9.9 mg/dL (ref 8.9–10.3)
Chloride: 106 mmol/L (ref 98–111)
Creatinine, Ser: 1.02 mg/dL — ABNORMAL HIGH (ref 0.44–1.00)
GFR, Estimated: >60 mL/min (ref >60)
Glucose, Bld: 119 mg/dL — ABNORMAL HIGH (ref 70–99)
Potassium: 4.8 mmol/L (ref 3.5–5.1)
Sodium: 140 mmol/L (ref 135–145)

## 2020-11-28 NOTE — Progress Notes (Signed)

## 2020-11-29 ENCOUNTER — Ambulatory Visit (HOSPITAL_BASED_OUTPATIENT_CLINIC_OR_DEPARTMENT_OTHER): Payer: Medicare Other | Admitting: Anesthesiology

## 2020-11-29 ENCOUNTER — Encounter (HOSPITAL_BASED_OUTPATIENT_CLINIC_OR_DEPARTMENT_OTHER): Payer: Self-pay | Admitting: Orthopedic Surgery

## 2020-11-29 ENCOUNTER — Encounter (HOSPITAL_BASED_OUTPATIENT_CLINIC_OR_DEPARTMENT_OTHER)
Admission: RE | Disposition: A | Discharge status: Home / Self Care | Payer: Self-pay | Source: Home / Self Care | Attending: Orthopedic Surgery

## 2020-11-29 ENCOUNTER — Other Ambulatory Visit: Payer: Self-pay

## 2020-11-29 ENCOUNTER — Ambulatory Visit (HOSPITAL_BASED_OUTPATIENT_CLINIC_OR_DEPARTMENT_OTHER)
Admission: RE | Admit: 2020-11-29 | Discharge: 2020-11-29 | Disposition: A | Discharge status: Home / Self Care | Payer: Medicare Other | Attending: Orthopedic Surgery | Admitting: Orthopedic Surgery

## 2020-11-29 DIAGNOSIS — E119 Type 2 diabetes mellitus without complications: Secondary | ICD-10-CM | POA: Insufficient documentation

## 2020-11-29 DIAGNOSIS — F17210 Nicotine dependence, cigarettes, uncomplicated: Secondary | ICD-10-CM | POA: Diagnosis not present

## 2020-11-29 DIAGNOSIS — G5601 Carpal tunnel syndrome, right upper limb: Secondary | ICD-10-CM | POA: Diagnosis not present

## 2020-11-29 DIAGNOSIS — Z8249 Family history of ischemic heart disease and other diseases of the circulatory system: Secondary | ICD-10-CM | POA: Insufficient documentation

## 2020-11-29 DIAGNOSIS — Z79891 Long term (current) use of opiate analgesic: Secondary | ICD-10-CM | POA: Insufficient documentation

## 2020-11-29 DIAGNOSIS — Z833 Family history of diabetes mellitus: Secondary | ICD-10-CM | POA: Insufficient documentation

## 2020-11-29 DIAGNOSIS — Z888 Allergy status to other drugs, medicaments and biological substances status: Secondary | ICD-10-CM | POA: Diagnosis not present

## 2020-11-29 DIAGNOSIS — Z8371 Family history of colonic polyps: Secondary | ICD-10-CM | POA: Insufficient documentation

## 2020-11-29 DIAGNOSIS — Z808 Family history of malignant neoplasm of other organs or systems: Secondary | ICD-10-CM | POA: Insufficient documentation

## 2020-11-29 DIAGNOSIS — Z79899 Other long term (current) drug therapy: Secondary | ICD-10-CM | POA: Insufficient documentation

## 2020-11-29 DIAGNOSIS — Z7984 Long term (current) use of oral hypoglycemic drugs: Secondary | ICD-10-CM | POA: Insufficient documentation

## 2020-11-29 HISTORY — PX: CARPAL TUNNEL RELEASE: SHX101

## 2020-11-29 HISTORY — DX: Other chronic pain: G89.29

## 2020-11-29 LAB — GLUCOSE, CAPILLARY
Glucose-Capillary: 91 mg/dL (ref 70–99)
Glucose-Capillary: 85 mg/dL (ref 70–99)

## 2020-11-29 SURGERY — CARPAL TUNNEL RELEASE
Anesthesia: Regional | Site: Wrist | Laterality: Right

## 2020-11-29 MED ORDER — OXYCODONE HCL 5 MG PO TABS
5.0000 mg | ORAL_TABLET | Freq: Once | ORAL | Status: DC | PRN
Start: 2020-11-29 — End: 2020-11-29

## 2020-11-29 MED ORDER — LACTATED RINGERS IV SOLN: INTRAVENOUS | Status: DC

## 2020-11-29 MED ORDER — DEXAMETHASONE SODIUM PHOSPHATE 10 MG/ML IJ SOLN
INTRAMUSCULAR | Status: AC
  Filled 2020-11-29: qty 1

## 2020-11-29 MED ORDER — CEFAZOLIN SODIUM-DEXTROSE 2-4 GM/100ML-% IV SOLN
INTRAVENOUS | Status: AC
  Filled 2020-11-29: qty 100

## 2020-11-29 MED ORDER — ONDANSETRON HCL 4 MG/2ML IJ SOLN
INTRAMUSCULAR | Status: AC
  Filled 2020-11-29: qty 2

## 2020-11-29 MED ORDER — OXYCODONE HCL 5 MG/5ML PO SOLN: 5.0000 mg | Freq: Once | ORAL | Status: DC | PRN

## 2020-11-29 MED ORDER — LIDOCAINE HCL (PF) 0.5 % IJ SOLN
INTRAMUSCULAR | Status: DC | PRN
  Administered 2020-11-29: 30 mL via INTRAVENOUS

## 2020-11-29 MED ORDER — ONDANSETRON HCL 4 MG/2ML IJ SOLN
INTRAMUSCULAR | Status: DC | PRN
  Administered 2020-11-29: 4 mg via INTRAVENOUS

## 2020-11-29 MED ORDER — MIDAZOLAM HCL 5 MG/5ML IJ SOLN
INTRAMUSCULAR | Status: DC | PRN
  Administered 2020-11-29: 2 mg via INTRAVENOUS

## 2020-11-29 MED ORDER — PROPOFOL 10 MG/ML IV BOLUS
INTRAVENOUS | Status: DC | PRN
Start: 2020-11-29 — End: 2020-11-29
  Administered 2020-11-29 (×2): 20 mg via INTRAVENOUS

## 2020-11-29 MED ORDER — BUPIVACAINE HCL (PF) 0.25 % IJ SOLN
INTRAMUSCULAR | Status: DC | PRN
  Administered 2020-11-29: 9 mL

## 2020-11-29 MED ORDER — CEFAZOLIN SODIUM-DEXTROSE 2-4 GM/100ML-% IV SOLN
2.0000 g | INTRAVENOUS | Status: AC
  Administered 2020-11-29: 2 g via INTRAVENOUS

## 2020-11-29 MED ORDER — FENTANYL CITRATE (PF) 100 MCG/2ML IJ SOLN
INTRAMUSCULAR | Status: AC
  Filled 2020-11-29: qty 2

## 2020-11-29 MED ORDER — MIDAZOLAM HCL 2 MG/2ML IJ SOLN
INTRAMUSCULAR | Status: AC
  Filled 2020-11-29: qty 2

## 2020-11-29 MED ORDER — LIDOCAINE HCL (PF) 2 % IJ SOLN
INTRAMUSCULAR | Status: AC
  Filled 2020-11-29: qty 5

## 2020-11-29 MED ORDER — HYDROMORPHONE HCL 1 MG/ML IJ SOLN: 0.2500 mg | INTRAMUSCULAR | Status: DC | PRN

## 2020-11-29 MED ORDER — FENTANYL CITRATE (PF) 100 MCG/2ML IJ SOLN
INTRAMUSCULAR | Status: DC | PRN
  Administered 2020-11-29 (×2): 50 ug via INTRAVENOUS

## 2020-11-29 MED ORDER — 0.9 % SODIUM CHLORIDE (POUR BTL) OPTIME
TOPICAL | Status: DC | PRN
  Administered 2020-11-29: 100 mL

## 2020-11-29 SURGICAL SUPPLY — 35 items
APL PRP STRL LF DISP 70% ISPRP (MISCELLANEOUS) ×1
BLADE SURG 15 STRL LF DISP TIS (BLADE) ×2 IMPLANT
BLADE SURG 15 STRL SS (BLADE) ×4
BNDG CMPR 9X4 STRL LF SNTH (GAUZE/BANDAGES/DRESSINGS)
BNDG ELASTIC 3X5.8 VLCR STR LF (GAUZE/BANDAGES/DRESSINGS) ×2 IMPLANT
BNDG ESMARK 4X9 LF (GAUZE/BANDAGES/DRESSINGS) IMPLANT
BNDG GAUZE ELAST 4 BULKY (GAUZE/BANDAGES/DRESSINGS) ×2 IMPLANT
CHLORAPREP W/TINT 26 (MISCELLANEOUS) ×2 IMPLANT
CORD BIPOLAR FORCEPS 12FT (ELECTRODE) ×2 IMPLANT
COVER BACK TABLE 60X90IN (DRAPES) ×2 IMPLANT
COVER MAYO STAND STRL (DRAPES) ×2 IMPLANT
CUFF TOURN SGL QUICK 18X4 (TOURNIQUET CUFF) ×2 IMPLANT
DRAPE EXTREMITY T 121X128X90 (DISPOSABLE) ×2 IMPLANT
DRAPE SURG 17X23 STRL (DRAPES) ×2 IMPLANT
DRSG PAD ABDOMINAL 8X10 ST (GAUZE/BANDAGES/DRESSINGS) ×2 IMPLANT
GAUZE SPONGE 4X4 12PLY STRL (GAUZE/BANDAGES/DRESSINGS) ×2 IMPLANT
GAUZE XEROFORM 1X8 LF (GAUZE/BANDAGES/DRESSINGS) ×2 IMPLANT
GLOVE SRG 8 PF TXTR STRL LF DI (GLOVE) ×1 IMPLANT
GLOVE SURG ENC MOIS LTX SZ7.5 (GLOVE) ×2 IMPLANT
GLOVE SURG UNDER POLY LF SZ8 (GLOVE) ×2
GOWN STRL REUS W/ TWL LRG LVL3 (GOWN DISPOSABLE) ×1 IMPLANT
GOWN STRL REUS W/TWL LRG LVL3 (GOWN DISPOSABLE) ×2
GOWN STRL REUS W/TWL XL LVL3 (GOWN DISPOSABLE) ×2 IMPLANT
NDL HYPO 25X1 1.5 SAFETY (NEEDLE) ×1 IMPLANT
NEEDLE HYPO 25X1 1.5 SAFETY (NEEDLE) ×2 IMPLANT
NS IRRIG 1000ML POUR BTL (IV SOLUTION) ×2 IMPLANT
PACK BASIN DAY SURGERY FS (CUSTOM PROCEDURE TRAY) ×2 IMPLANT
PADDING CAST ABS 4INX4YD NS (CAST SUPPLIES) ×1
PADDING CAST ABS COTTON 4X4 ST (CAST SUPPLIES) ×1 IMPLANT
STOCKINETTE 4X48 STRL (DRAPES) ×2 IMPLANT
SUT ETHILON 4 0 PS 2 18 (SUTURE) ×2 IMPLANT
SYR BULB EAR ULCER 3OZ GRN STR (SYRINGE) ×2 IMPLANT
SYR CONTROL 10ML LL (SYRINGE) ×2 IMPLANT
TOWEL GREEN STERILE FF (TOWEL DISPOSABLE) ×4 IMPLANT
UNDERPAD 30X36 HEAVY ABSORB (UNDERPADS AND DIAPERS) ×2 IMPLANT

## 2020-11-29 NOTE — Anesthesia Postprocedure Evaluation (Signed)
Anesthesia Post Note  Patient: Christie Cervantes  Procedure(s) Performed: RIGHT CARPAL TUNNEL RELEASE (Right: Wrist)     Patient location during evaluation: PACU Anesthesia Type: Bier Block Level of consciousness: awake and alert Pain management: pain level controlled Vital Signs Assessment: post-procedure vital signs reviewed and stable Respiratory status: spontaneous breathing and respiratory function stable Cardiovascular status: stable Postop Assessment: no apparent nausea or vomiting Anesthetic complications: no   No notable events documented.  Last Vitals:  Vitals:   11/29/20 1237 11/29/20 1255  BP: (!) 137/93 133/78  Pulse: 67   Resp: 15 18  Temp:  36.6 C  SpO2: 98% 97%    Last Pain:  Vitals:   11/29/20 1255  TempSrc:   PainSc: 0-No pain                 [name]

## 2020-11-29 NOTE — Op Note (Signed)
11/29/2020 [facility]                              OPERATIVE REPORT   PREOPERATIVE DIAGNOSIS:  Right carpal tunnel syndrome.  POSTOPERATIVE DIAGNOSIS:  Right carpal tunnel syndrome.  PROCEDURE:  Right carpal tunnel release.  SURGEON:  Betha Loa, MD  ASSISTANT:  none.  ANESTHESIA: Bier block with sedation  IV FLUIDS:  Per anesthesia flow sheet.  ESTIMATED BLOOD LOSS:  Minimal.  COMPLICATIONS:  None.  SPECIMENS:  None.  TOURNIQUET TIME:    Total Tourniquet Time Documented: area (Right) - 22 minutes Total: area (Right) - 22 minutes   DISPOSITION:  Stable to PACU.  LOCATION: [facility]  INDICATIONS:  65 yo female with numbness and tingling right hand.  Positive nerve conduction studies.   She wishes to have a carpal tunnel release for management of her symptoms.  Risks, benefits and alternatives of surgery were discussed including the risk of blood loss; infection; damage to nerves, vessels, tendons, ligaments, bone; failure of surgery; need for additional surgery; complications with wound healing; continued pain; recurrence of carpal tunnel syndrome; and damage to motor branch. She voiced understanding of these risks and elected to proceed.   OPERATIVE COURSE:  After being identified preoperatively by myself, the patient and I agreed upon the procedure and site of procedure.  The surgical site was marked.  Surgical consent had been signed.  She was given IV Ancef as preoperative antibiotic prophylaxis.  She was transferred to the operating room and placed on the operating room table in supine position with the Right upper extremity on an armboard.  Bier block anesthesia was induced by the anesthesiologist.  Right upper extremity was prepped and draped in normal sterile orthopaedic fashion.  A surgical pause was performed between the surgeons, anesthesia, and operating room staff, and all were in agreement as to the patient, procedure, and site of  procedure.  Tourniquet at the proximal aspect of the forearm had been inflated for the Bier block  Incision was made over the transverse carpal ligament and carried into the subcutaneous tissues by spreading technique.  Bipolar electrocautery was used to obtain hemostasis.  The palmar fascia was sharply incised.  The transverse carpal ligament was identified and sharply incised.  It was incised distally first.  The flexor tendons were identified.  The flexor tendon to the little finger was identified and retracted radially.  The transverse carpal ligament was then incised proximally.  Scissors were used to split the distal aspect of the volar antebrachial fascia.  A finger was placed into the wound to ensure complete decompression, which was the case.  The nerve was examined.  It was adherent to the radial leaflet.  The motor branch was identified and was intact.  The wound was copiously irrigated with sterile saline.  It was then closed with 4-0 nylon in a horizontal mattress fashion.  It was injected with 0.25% plain Marcaine to aid in postoperative analgesia.  It was dressed with sterile Xeroform, 4x4s, an ABD, and wrapped with Kerlix and an Ace bandage.  Tourniquet was deflated at 22 minutes.  Fingertips were pink with brisk capillary refill after deflation of the tourniquet.  Operative drapes were broken down.  The patient was awoken from anesthesia safely.  She was transferred back to stretcher and taken to the PACU in stable condition.  I will see her back in the office in 1  week for postoperative followup.  She states she has pain medication at home and does not need a new prescription.    Betha Loa, MD Electronically signed, 11/29/20

## 2020-11-29 NOTE — Transfer of Care (Signed)
Immediate Anesthesia Transfer of Care Note  Patient: Christie Cervantes  Procedure(s) Performed: RIGHT CARPAL TUNNEL RELEASE (Right: Wrist)  Patient Location: PACU  Anesthesia Type:Bier block  Level of Consciousness: awake  Airway & Oxygen Therapy: Patient Spontanous Breathing and Patient connected to face mask oxygen  Post-op Assessment: Report given to RN and Post -op Vital signs reviewed and stable  Post vital signs: Reviewed and stable  Last Vitals:  Vitals Value Taken Time  BP 129/92 11/29/20 1215  Temp    Pulse 64 11/29/20 1215  Resp 10 11/29/20 1215  SpO2 100 % 11/29/20 1215  Vitals shown include unvalidated device data.  Last Pain:  Vitals:   11/29/20 1029  TempSrc: Oral  PainSc: 0-No pain         Complications: No notable events documented.

## 2020-11-29 NOTE — Discharge Instructions (Addendum)

## 2020-11-29 NOTE — H&P (Signed)
Christie Cervantes is an 65 y.o. female.   Chief Complaint: carpal tunnel syndrome HPI: 65 yo female with numbness and tingling in fingers of right hand.  Positive nerve conduction studies.  She wishes to have right carpal tunnel release.  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   Anxiety    situational   Chronic pain    on chronic opioids   Constipation due to opioid therapy 06/25/2020   DDD (degenerative disc disease)    back   Depression    hx of   Diabetes mellitus without complication (HCC)    on meds   Heart murmur    earlier in life   Hyperlipidemia    on meds   Hypertension    no meds now   Thyroid disease    on meds   Tinnitus     Past Surgical History:  Procedure Laterality Date   ABDOMINAL HYSTERECTOMY     PARTIAL   APPENDECTOMY     COLONOSCOPY  2013   CG-MAC-2 day moviprep-normal   CYST EXCISION Left 1990   left breast cyst removed   UTERINE FIBROID SURGERY     x2   WISDOM TOOTH EXTRACTION      Family History: Family History  Problem Relation Age of Onset   Multiple myeloma Mother 43   Diabetes Sister    Colon polyps Sister 43   Hypercalcemia Brother    Hypertension Brother    Diabetes Sister    Colon cancer Neg Hx    Esophageal cancer Neg Hx    Stomach cancer Neg Hx    Rectal cancer Neg Hx     Social History:   reports that she has been smoking cigarettes. She has a 5.00 pack-year smoking history. She has never used smokeless tobacco. She reports that she does not drink alcohol and does not use drugs.  Medications: Medications Prior to Admission  Medication Sig Dispense Refill   amitriptyline (ELAVIL) 50 MG tablet Take 50 mg by mouth at bedtime.     atorvastatin (LIPITOR) 20 MG tablet Take 20 mg by mouth daily.     bisacodyl (DULCOLAX) 5 MG EC tablet Take  5 mg by mouth daily as needed for moderate constipation.     clonazePAM (KLONOPIN) 1 MG tablet Take 0.5 mg by mouth 2 (two) times daily as needed for anxiety.     HYDROcodone-acetaminophen (NORCO) 7.5-325 MG tablet 1 tablet every 6 (six) hours as needed.     levothyroxine (SYNTHROID) 75 MCG tablet Take 75 mcg by mouth daily before breakfast.     metFORMIN (GLUCOPHAGE) 500 MG tablet Take 1 tablet by mouth daily.     morphine (MS CONTIN) 60 MG 12 hr tablet Take 60 mg by mouth 2 (two) times daily.     polyethylene glycol (MIRALAX / GLYCOLAX) 17 g packet Take 17 g by mouth daily.      Results for orders placed or performed during the hospital encounter of 11/29/20 (from the past 48 hour(s))  Basic metabolic panel per protocol     Status: Abnormal   Collection Time: 11/28/20  1:43 PM  Result Value Ref Range   Sodium 140 135 - 145 mmol/L   Potassium 4.8 3.5 - 5.1 mmol/L   Chloride 106 98 - 111 mmol/L   CO2 29 22 -  32 mmol/L   Glucose, Bld 119 (H) 70 - 99 mg/dL    Comment: Glucose reference range applies only to samples taken after fasting for at least 8 hours.   BUN 7 (L) 8 - 23 mg/dL   Creatinine, Ser 1.02 (H) 0.44 - 1.00 mg/dL   Calcium 9.9 8.9 - 10.3 mg/dL   GFR, Estimated >60 >60 mL/min    Comment: (NOTE) Calculated using the CKD-EPI Creatinine Equation (2021)    Anion gap 5 5 - 15    Comment: REPEATED TO VERIFY Performed at [facility] [address] [place], [place] [address]   Glucose, capillary     Status: None   Collection Time: 11/29/20 10:19 AM  Result Value Ref Range   Glucose-Capillary 91 70 - 99 mg/dL    Comment: Glucose reference range applies only to samples taken after fasting for at least 8 hours.    No results found.   A comprehensive review of systems was negative.  Blood pressure 130/86, pulse 90, temperature 98.4 F (36.9 C), temperature source Oral, resp. rate 16, height _0  (1.702 m), weight 66.4 kg, SpO2 98 %.  General appearance: alert,  cooperative, and appears stated age Head: Normocephalic, without obvious abnormality, atraumatic Neck: supple, symmetrical, trachea midline Cardio: regular rate and rhythm Resp: clear to auscultation bilaterally Extremities: Intact capillary refill all digits.  +epl/fpl/io.  No wounds.  Pulses: 2+ and symmetric Skin: Skin color, texture, turgor normal. No rashes or lesions Neurologic: Grossly normal Incision/Wound: none  Assessment/Plan Right carpal tunnel syndrome.  Non operative and operative treatment options have been discussed with the patient and patient wishes to proceed with operative treatment. Risks, benefits, and alternatives of surgery have been discussed and the patient agrees with the plan of care.   Christie Cervantes 11/29/2020, 11:26 AM

## 2020-11-29 NOTE — Anesthesia Preprocedure Evaluation (Signed)
Anesthesia Evaluation  Patient identified by MRN, date of birth, ID band Patient awake    Reviewed: Allergy & Precautions, NPO status , Patient's Chart, lab work & pertinent test results  Airway Mallampati: II  TM Distance: >3 FB Neck ROM: Full    Dental no notable dental hx.    Pulmonary neg pulmonary ROS, Current Smoker and Patient abstained from smoking.,    Pulmonary exam normal breath sounds clear to auscultation       Cardiovascular hypertension, Pt. on medications negative cardio ROS Normal cardiovascular exam Rhythm:Regular Rate:Normal     Neuro/Psych  Headaches, Anxiety Depression negative psych ROS   GI/Hepatic negative GI ROS, Neg liver ROS,   Endo/Other  negative endocrine ROSdiabetes, Type 2, Oral Hypoglycemic Agents  Renal/GU negative Renal ROS  negative genitourinary   Musculoskeletal  (+) Arthritis , Osteoarthritis,    Abdominal   Peds negative pediatric ROS (+)  Hematology negative hematology ROS (+)   Anesthesia Other Findings   Reproductive/Obstetrics negative OB ROS                             Anesthesia Physical Anesthesia Plan  ASA: 3  Anesthesia Plan: Bier Block and Bier Block-LIDOCAINE ONLY   Post-op Pain Management:    Induction: Intravenous  PONV Risk Score and Plan: 1 and Ondansetron and Treatment may vary due to age or medical condition  Airway Management Planned: Simple Face Mask  Additional Equipment:   Intra-op Plan:   Post-operative Plan:   Informed Consent: I have reviewed the patients History and Physical, chart, labs and discussed the procedure including the risks, benefits and alternatives for the proposed anesthesia with the patient or authorized representative who has indicated his/her understanding and acceptance.     Dental advisory given  Plan Discussed with: CRNA  Anesthesia Plan Comments:         Anesthesia Quick  Evaluation

## 2020-12-03 ENCOUNTER — Encounter (HOSPITAL_BASED_OUTPATIENT_CLINIC_OR_DEPARTMENT_OTHER): Payer: Self-pay | Admitting: Orthopedic Surgery

## 2021-01-18 NOTE — Progress Notes (Incomplete)
Cardiology Office Note   Evaluation Performed:  Follow-up  Date:  01/18/2021   ID:  Christie Cervantes, DOB 24-Sep-1955, MRN [id number]  PCP:  Christie Rosser, PA-C  Cardiologist:  None  Electrophysiologist:  None   Chief Complaint:  Shortness of breath, palpitations  History of Present Illness:    Christie Cervantes is a 65 y.o. female with hypertension and hypothyroidism who is being seen for follow-up. She was initially seen 08/05/2018 via telemedicine for the evaluation of palpitations at the request of Jenera, PA-C.  Christie Cervantes went out grocery shopping and felt extremely short of breath when carrying the groceries up the steps.  She started gasping for air and had to rip off the mask.  The following day she had a similar episode when mopping the floor.  She felt dizzy as though she was going to faint and was unable to get air.  The episode is associated with palpitations but no chest pain or pressure.  She also denies diaphoresis or nausea.  Five days later she went to the kitchen and had a more prolonged episode while walking so she called EMS. That episode lasted 7 minutes, while prior episodes were approximately 3 minutes.  When EMS arrived her BP was 140/100 and hear heart rate was normal but the symptoms had subsided.  EMS allowed her to stay home.  The following day she was seen by her PCP and had lab work that is not available at this time but she reports it was normal.  She notes that if she doesn't exert herself she is OK.  However, if she starts to walk quickly the symptoms recur.  Christie Cervantes was seen in the ED 07/22/2018 due to a recurrent episode of exertional dyspnea and palpitations.  In the ED blood pressure was 137/93 and her heart rate was 86.  She was noted to be mostly edentulous but did have several rotting teeth.  The rest of her exam was unremarkable.  EKG revealed sinus rhythm and chest x-ray was unremarkable.  Cardiac enzymes and BNP were within normal limits.  She was noted to  have a creatinine of 1.5 but labs were essentially otherwise unremarkable.  She was concerned that it could be related to a dental infection and was treated with antibiotics. She was referred for an ETT 09/2018 that was negative for ischemia. She wore an ambulatory monitor that showed rare PACs and PVCs.   Today,  She denies any palpitations, chest pain, or shortness of breath. No lightheadedness, headaches, syncope, orthopnea, or PND. Also has no lower extremity edema or exertional symptoms.  (+)   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   Anxiety    situational   Chronic pain    on chronic opioids   Constipation due to opioid therapy 06/25/2020   DDD (degenerative disc disease)    back   Depression    hx of   Diabetes mellitus without complication ([place])    on meds   Heart murmur    earlier in life   Hyperlipidemia    on meds   Hypertension    no meds now   Thyroid disease    on meds   Tinnitus    Past Surgical History:  Procedure Laterality Date   ABDOMINAL HYSTERECTOMY     PARTIAL   APPENDECTOMY     CARPAL TUNNEL RELEASE Right 11/29/2020   Procedure: RIGHT CARPAL TUNNEL RELEASE;  Surgeon: Leanora Cover, MD;  Location: [facility];  Service: Orthopedics;  Laterality: Right;  30 MIN   COLONOSCOPY  2013   CG-MAC-2 day moviprep-normal   CYST EXCISION Left 1990   left breast cyst removed   UTERINE FIBROID SURGERY     x2   WISDOM TOOTH EXTRACTION       No outpatient medications have been marked as taking for the 01/21/21 encounter (Appointment) with Skeet Latch, MD.     Allergies:   Compazine [prochlorperazine maleate] and Diphenhydramine   Social History   Tobacco Use   Smoking status: Every Day    Packs/day: 0.25    Years: 20.00    Pack years: 5.00    Types: Cigarettes   Smokeless tobacco: Never   Tobacco comments:    Smokes 2-3 cigs daily and vapes daily  Vaping Use   Vaping Use: Every day    Substances: Nicotine  Substance Use Topics   Alcohol use: No   Drug use: No     Family Hx: The patient's family history includes Colon polyps (age of onset: 73) in her sister; Diabetes in her sister and sister; Hypercalcemia in her brother; Hypertension in her brother; Multiple myeloma (age of onset: 57) in her mother. There is no history of Colon cancer, Esophageal cancer, Stomach cancer, or Rectal cancer.  ROS:   Please see the history of present illness.     All other systems reviewed and are negative.   Prior CV studies:   The following studies were reviewed today:  ETT 09/09/2018: There was no ST segment deviation noted during stress. No T wave inversion was noted during stress. Blood pressure demonstrated a normal response to exercise. Normal ECG stress test.  Monitor 08/27/2018: 7 Day Zio Monitor   Quality: Fair.  Baseline artifact. Predominant rhythm: sinus rhythm Average heart rate: 72 bpm Max heart rate: 134 bpm Min heart rate: 52 bpm   Rare PACs and PVCs No significant arrhythmias   Labs/Other Tests and Data Reviewed:    EKG:  01/21/2021: Sinus ***. Rate *** bpm. 07/22/2018: Sinus rhythm. Rate 93 bpm. Non-specific T wave abnormality. RA enlargement.   Recent Labs: 11/28/2020: BUN 7; Creatinine, Ser 1.02; Potassium 4.8; Sodium 140   Recent Lipid Panel Lab Results  Component Value Date/Time   CHOL 176 12/27/2009 09:51 PM   TRIG 242 (H) 12/27/2009 09:51 PM   HDL 32 (L) 12/27/2009 09:51 PM   CHOLHDL 5.5 Ratio 12/27/2009 09:51 PM   LDLCALC 96 12/27/2009 09:51 PM    Wt Readings from Last 3 Encounters:  11/29/20 146 lb 6.2 oz (66.4 kg)  06/25/20 153 lb (69.4 kg)  04/17/20 153 lb 9.6 oz (69.7 kg)     Objective:    VS:  There were no vitals taken for this visit. , BMI There is no height or weight on file to calculate BMI. GENERAL:  Well appearing HEENT: Pupils equal round and reactive, fundi not visualized, oral mucosa unremarkable NECK:  No jugular  venous distention, waveform within normal limits, carotid upstroke brisk and symmetric, no bruits, no thyromegaly LYMPHATICS:  No cervical adenopathy LUNGS:  Clear to auscultation bilaterally HEART:  RRR.  PMI not displaced or sustained,S1 and S2 within normal limits, no S3, no S4, no clicks, no rubs, *** murmurs ABD:  Flat, positive bowel sounds normal in frequency in pitch, no bruits, no rebound, no guarding, no midline pulsatile mass, no hepatomegaly, no splenomegaly EXT:  2 plus pulses throughout, no edema, no cyanosis no clubbing SKIN:  No  rashes no nodules NEURO:  Cranial nerves II through XII grossly intact, motor grossly intact throughout PSYCH:  Cognitively intact, oriented to person place and time   ASSESSMENT & PLAN:   No problem-specific Assessment & Plan notes found for this encounter.  # Palpitations: # Shortness of breath:  Episodes are atypical for ischemia.  However the fact that it happens with exertion is concerning.  No arrhythmias were detected on telemetry or EKG.  However, she is not having symptoms at the time.  We will get a 7-day ZIO patch.  We will also get a ETT to see what happens with exertion.  She has already had laboratory testing.  We will need to get a copy of the labs from her PCP.  She has been treated with levothyroxine for the last year and hopefully thyroid panel was drawn.  She reports not being particularly anxious.  She seems to have improved somewhat after antibiotics.  It is unclear how this would be related to an oral infection.  Endocarditis seems unlikely in the setting of a normal white blood cell count and normal H/H.  If the above studies are unremarkable we will consider getting an echocardiogram.  # Hypertension:  BP was poorly controlled in the ED and with EMS.  Other recent medical encounters show that her blood pressure was better-controlled.  She will get a home monitor and track.  Continue HCTZ for now.  # Hyperlipidemia: Continue  atorvastatin 72m daily.  Lipids are managed with her PCP.  # Tobacco abuse: Cessation advised.       Medication Adjustments/Labs and Tests Ordered: Current medicines are reviewed at length with the patient today.  Concerns regarding medicines are outlined above.   Tests Ordered: No orders of the defined types were placed in this encounter.   Medication Changes: No orders of the defined types were placed in this encounter.   Disposition:   Follow up with APP in 1 month.   [facility]. [facility]val [facility]sey [facility] ***4 months.   I,Mathew Stumpf,acting as a sEducation administratorfor TSkeet Latch MD.,have documented all relevant documentation on the behalf of TSkeet Latch MD,as directed by  TSkeet Latch MD while in the presence of TSkeet Latch MD.  ***  Signed, MMadelin Rear 01/18/2021 10:37 AM    [place]

## 2021-01-21 ENCOUNTER — Ambulatory Visit (HOSPITAL_BASED_OUTPATIENT_CLINIC_OR_DEPARTMENT_OTHER): Payer: Medicare Other | Admitting: Cardiovascular Disease

## 2021-02-14 ENCOUNTER — Encounter (INDEPENDENT_AMBULATORY_CARE_PROVIDER_SITE_OTHER): Payer: Medicare Other | Admitting: Ophthalmology

## 2021-03-14 ENCOUNTER — Encounter (INDEPENDENT_AMBULATORY_CARE_PROVIDER_SITE_OTHER): Payer: Medicare Other | Admitting: Ophthalmology

## 2021-03-19 ENCOUNTER — Other Ambulatory Visit: Payer: Self-pay

## 2021-03-19 ENCOUNTER — Encounter (INDEPENDENT_AMBULATORY_CARE_PROVIDER_SITE_OTHER): Payer: Medicare Other | Admitting: Ophthalmology

## 2021-03-19 ENCOUNTER — Ambulatory Visit (INDEPENDENT_AMBULATORY_CARE_PROVIDER_SITE_OTHER): Payer: Medicare Other | Admitting: Ophthalmology

## 2021-03-19 ENCOUNTER — Encounter (INDEPENDENT_AMBULATORY_CARE_PROVIDER_SITE_OTHER): Payer: Self-pay | Admitting: Ophthalmology

## 2021-03-19 DIAGNOSIS — E119 Type 2 diabetes mellitus without complications: Secondary | ICD-10-CM | POA: Diagnosis not present

## 2021-03-19 DIAGNOSIS — H43821 Vitreomacular adhesion, right eye: Secondary | ICD-10-CM

## 2021-03-19 DIAGNOSIS — H35372 Puckering of macula, left eye: Secondary | ICD-10-CM

## 2021-03-19 DIAGNOSIS — H43822 Vitreomacular adhesion, left eye: Secondary | ICD-10-CM | POA: Diagnosis not present

## 2021-03-19 NOTE — Assessment & Plan Note (Signed)
Physiologic OD, will observe

## 2021-03-19 NOTE — Assessment & Plan Note (Signed)
The nature of macular pucker (epiretinal membrane ERM) was discussed with the patient as well as threshold criteria for vitrectomy surgery. I explained that in rare cases another surgery is needed to actually remove a second wrinkle should it regrow.  Most often, the epiretinal membrane and underlying wrinkled internal limiting membrane are removed with the first surgery, to accomplish the goals.   If the operative eye is Phakic (natural lens still present), cataract surgery is often recommended prior to Vitrectomy. This will enable the retina surgeon to have the best view during surgery and the patient to obtain optimal results in the future. Treatment options were discussed.  I have recommended at home monitoring the near vision task in a monocular (1 eye at a time), with or without near vision glasses, to look for changes or declines in reading.   

## 2021-03-19 NOTE — Assessment & Plan Note (Signed)

## 2021-03-19 NOTE — Progress Notes (Signed)
03/19/2021     CHIEF COMPLAINT Patient presents for  Chief Complaint  Patient presents with   Diabetes      HISTORY OF PRESENT ILLNESS: Christie Cervantes is a 65 y.o. female who presents to the clinic today for:   HPI     Diabetes           Diet: diabetic         Comments   Follow-up 1 year post last visit for diabetes mellitus and a history VMA      Last edited by Hurman Horn, MD on 03/19/2021  3:28 PM.      Referring physician: Shanon Rosser, PA-C [place],   [phone]  HISTORICAL INFORMATION:   Selected notes from the [facility]: No current outpatient medications on file. (Ophthalmic Drugs)   No current facility-administered medications for this visit. (Ophthalmic Drugs)   Current Outpatient Medications (Other)  Medication Sig   amitriptyline (ELAVIL) 50 MG tablet Take 50 mg by mouth at bedtime.   atorvastatin (LIPITOR) 20 MG tablet Take 20 mg by mouth daily.   bisacodyl (DULCOLAX) 5 MG EC tablet Take 5 mg by mouth daily as needed for moderate constipation.   clonazePAM (KLONOPIN) 1 MG tablet Take 0.5 mg by mouth 2 (two) times daily as needed for anxiety.   HYDROcodone-acetaminophen (NORCO) 7.5-325 MG tablet 1 tablet every 6 (six) hours as needed.   levothyroxine (SYNTHROID) 75 MCG tablet Take 75 mcg by mouth daily before breakfast.   metFORMIN (GLUCOPHAGE) 500 MG tablet Take 1 tablet by mouth daily.   morphine (MS CONTIN) 60 MG 12 hr tablet Take 60 mg by mouth 2 (two) times daily.   polyethylene glycol (MIRALAX / GLYCOLAX) 17 g packet Take 17 g by mouth daily.   No current facility-administered medications for this visit. (Other)      REVIEW OF SYSTEMS:    ALLERGIES Allergies  Allergen Reactions   Compazine [Prochlorperazine Maleate] Other (See Comments)    Abnormal Behavior   Diphenhydramine     PAST MEDICAL HISTORY Past Medical History:  Diagnosis Date   Angiodysplasia  of colon 06/25/2020   Anxiety    situational   Chronic pain    on chronic opioids   Constipation due to opioid therapy 06/25/2020   DDD (degenerative disc disease)    back   Depression    hx of   Diabetes mellitus without complication ([place])    on meds   Heart murmur    earlier in life   Hyperlipidemia    on meds   Hypertension    no meds now   Thyroid disease    on meds   Tinnitus    Past Surgical History:  Procedure Laterality Date   ABDOMINAL HYSTERECTOMY     PARTIAL   APPENDECTOMY     CARPAL TUNNEL RELEASE Right 11/29/2020   Procedure: RIGHT CARPAL TUNNEL RELEASE;  Surgeon: Leanora Cover, MD;  Location: [facility];  Service: Orthopedics;  Laterality: Right;  30 MIN   COLONOSCOPY  2013   CG-MAC-2 day moviprep-normal   CYST EXCISION Left 1990   left breast cyst removed   UTERINE FIBROID SURGERY     x2   WISDOM TOOTH EXTRACTION      FAMILY HISTORY Family History  Problem Relation Age of Onset   Multiple myeloma Mother 67   Diabetes Sister    Colon polyps Sister 49  Hypercalcemia Brother    Hypertension Brother    Diabetes Sister    Colon cancer Neg Hx    Esophageal cancer Neg Hx    Stomach cancer Neg Hx    Rectal cancer Neg Hx     SOCIAL HISTORY Social History   Tobacco Use   Smoking status: Every Day    Packs/day: 0.25    Years: 20.00    Pack years: 5.00    Types: Cigarettes   Smokeless tobacco: Never   Tobacco comments:    Smokes 2-3 cigs daily and vapes daily  Vaping Use   Vaping Use: Every day   Substances: Nicotine  Substance Use Topics   Alcohol use: No   Drug use: No         OPHTHALMIC EXAM:  Base Eye Exam     Visual Acuity (ETDRS)       Right Left   Dist [place] 20/25 20/30         Tonometry (Tonopen, 3:27 PM)       Right Left   Pressure 11 10         Pupils       Pupils   Right PERRL   Left PERRL         Visual Fields       Left Right    Full Full         Extraocular Movement        Right Left    Full, Ortho Full, Ortho         Neuro/Psych     Oriented x3: Yes   Mood/Affect: Normal         Dilation     Both eyes: 1.0% Mydriacyl, 2.5% Phenylephrine @ 3:26 PM           Slit Lamp and Fundus Exam     External Exam       Right Left   External Normal Normal         Slit Lamp Exam       Right Left   Lids/Lashes Normal Normal   Conjunctiva/Sclera White and quiet White and quiet   Cornea Clear Clear   Anterior Chamber Deep and quiet Deep and quiet   Iris Round and reactive Round and reactive   Lens 1+ Nuclear sclerosis 1+ Nuclear sclerosis   Anterior Vitreous Normal Normal         Fundus Exam       Right Left   Posterior Vitreous Normal Normal   Disc Normal Normal   C/D Ratio 0.35 0.4   Macula Normal Epiretinal membrane   Vessels no DR no DR   Periphery Normal Normal            IMAGING AND PROCEDURES  Imaging and Procedures for 03/19/21  OCT, Retina - OU - Both Eyes       Right Eye Quality was good. Scan locations included subfoveal. Central Foveal Thickness: 249. Progression has been stable. Findings include vitreomacular adhesion , normal foveal contour.   Left Eye Quality was good. Scan locations included subfoveal. Central Foveal Thickness: 328. Progression has been stable. Findings include abnormal foveal contour, epiretinal membrane.   Notes Incidental vitreal macular adhesion right eye, prefoveal floater in the left eye could signify partial PVD             ASSESSMENT/PLAN:  Diabetes mellitus without complication ([place]) The patient has diabetes without any evidence of retinopathy. The patient advised to maintain good blood glucose control,  excellent blood pressure control, and favorable levels of cholesterol, low density lipoprotein, and high density lipoproteins. Follow up in 1 year was recommended. Explained that fluctuations in visual acuity , or "out of focus", may result from large variations of blood  sugar control.  Epiretinal membrane, left eye The nature of macular pucker (epiretinal membrane ERM) was discussed with the patient as well as threshold criteria for vitrectomy surgery. I explained that in rare cases another surgery is needed to actually remove a second wrinkle should it regrow.  Most often, the epiretinal membrane and underlying wrinkled internal limiting membrane are removed with the first surgery, to accomplish the goals.   If the operative eye is Phakic (natural lens still present), cataract surgery is often recommended prior to Vitrectomy. This will enable the retina surgeon to have the best view during surgery and the patient to obtain optimal results in the future. Treatment options were discussed.  I have recommended at home monitoring the near vision task in a monocular (1 eye at a time), with or without near vision glasses, to look for changes or declines in reading.  Vitreomacular adhesion of right eye Physiologic OD, will observe     ICD-10-CM   1. Vitreomacular adhesion of left eye  H43.822 OCT, Retina - OU - Both Eyes    2. Vitreomacular adhesion of right eye  H43.821 OCT, Retina - OU - Both Eyes    3. Epiretinal membrane, left eye  H35.372     4. Diabetes mellitus without complication ([employer])  Y10.1       1.Observe the ERM OS  2.Mild NSC changes OU, NEEDS general eye exam f/u Refer to Dr Marylynn Pearson  3.No DR seen today   Ophthalmic Meds Ordered this visit:  No orders of the defined types were placed in this encounter.      Return in about 2 years (around 03/20/2023) for DILATE OU, OCT.  There are no Patient Instructions on file for this visit.   Explained the diagnoses, plan, and follow up with the patient and they expressed understanding.  Patient expressed understanding of the importance of proper follow up care.   Clent Demark Mattie Novosel M.D. Diseases & Surgery of the Retina and Vitreous Retina [facility] Diabetic [place] 03/19/21     Abbreviations: M myopia (nearsighted); A astigmatism; H hyperopia (farsighted); P presbyopia; Mrx spectacle prescription;  CTL contact lenses; OD right eye; OS left eye; OU both eyes  XT exotropia; ET esotropia; PEK punctate epithelial keratitis; PEE punctate epithelial erosions; DES dry eye syndrome; MGD meibomian gland dysfunction; ATs artificial tears; PFAT's preservative free artificial tears; Hall nuclear sclerotic cataract; PSC posterior subcapsular cataract; ERM epi-retinal membrane; PVD posterior vitreous detachment; RD retinal detachment; DM diabetes mellitus; DR diabetic retinopathy; NPDR non-proliferative diabetic retinopathy; PDR proliferative diabetic retinopathy; CSME clinically significant macular edema; DME diabetic macular edema; dbh dot blot hemorrhages; CWS cotton wool spot; POAG primary open angle glaucoma; C/D cup-to-disc ratio; HVF humphrey visual field; GVF goldmann visual field; OCT optical coherence tomography; IOP intraocular pressure; BRVO Branch retinal vein occlusion; CRVO central retinal vein occlusion; CRAO central retinal artery occlusion; BRAO branch retinal artery occlusion; RT retinal tear; SB scleral buckle; PPV pars plana vitrectomy; VH Vitreous hemorrhage; PRP panretinal laser photocoagulation; IVK intravitreal kenalog; VMT vitreomacular traction; MH Macular hole;  NVD neovascularization of the disc; NVE neovascularization elsewhere; AREDS age related eye disease study; ARMD age related macular degeneration; POAG primary open angle glaucoma; EBMD epithelial/anterior basement membrane dystrophy; ACIOL anterior chamber intraocular  lens; IOL intraocular lens; PCIOL posterior chamber intraocular lens; Phaco/IOL phacoemulsification with intraocular lens placement; [place] photorefractive keratectomy; LASIK laser assisted in situ keratomileusis; HTN hypertension; DM diabetes mellitus; COPD chronic obstructive pulmonary disease

## 2021-04-29 ENCOUNTER — Other Ambulatory Visit: Payer: Self-pay | Admitting: Physician Assistant

## 2021-04-29 DIAGNOSIS — Z1231 Encounter for screening mammogram for malignant neoplasm of breast: Secondary | ICD-10-CM

## 2021-05-10 ENCOUNTER — Ambulatory Visit: Payer: Medicare Other

## 2021-05-23 ENCOUNTER — Ambulatory Visit
Admission: RE | Admit: 2021-05-23 | Discharge: 2021-05-23 | Disposition: A | Discharge status: Home / Self Care | Payer: Medicare Other | Source: Ambulatory Visit | Attending: Physician Assistant | Admitting: Physician Assistant

## 2021-05-23 DIAGNOSIS — Z1231 Encounter for screening mammogram for malignant neoplasm of breast: Secondary | ICD-10-CM

## 2022-06-26 ENCOUNTER — Encounter (INDEPENDENT_AMBULATORY_CARE_PROVIDER_SITE_OTHER): Payer: 59 | Admitting: Ophthalmology

## 2022-07-28 ENCOUNTER — Other Ambulatory Visit: Payer: Self-pay | Admitting: Family Medicine

## 2022-07-28 DIAGNOSIS — R1900 Intra-abdominal and pelvic swelling, mass and lump, unspecified site: Secondary | ICD-10-CM

## 2022-08-29 ENCOUNTER — Ambulatory Visit
Admission: RE | Admit: 2022-08-29 | Discharge: 2022-08-29 | Disposition: A | Discharge status: Home / Self Care | Payer: 59 | Source: Ambulatory Visit | Attending: Family Medicine | Admitting: Family Medicine

## 2022-08-29 DIAGNOSIS — R1900 Intra-abdominal and pelvic swelling, mass and lump, unspecified site: Secondary | ICD-10-CM

## 2022-08-29 MED ORDER — IOPAMIDOL (ISOVUE-300) INJECTION 61%
100.0000 mL | Freq: Once | INTRAVENOUS | Status: AC | PRN
  Administered 2022-08-29: 100 mL via INTRAVENOUS

## 2022-09-24 ENCOUNTER — Other Ambulatory Visit: Payer: Self-pay | Admitting: Family Medicine

## 2022-09-24 ENCOUNTER — Other Ambulatory Visit: Payer: Self-pay | Admitting: Radiology

## 2022-09-24 ENCOUNTER — Ambulatory Visit
Admission: RE | Admit: 2022-09-24 | Discharge: 2022-09-24 | Disposition: A | Discharge status: Home / Self Care | Payer: 59 | Source: Ambulatory Visit | Attending: Family Medicine | Admitting: Family Medicine

## 2022-09-24 DIAGNOSIS — Z1231 Encounter for screening mammogram for malignant neoplasm of breast: Secondary | ICD-10-CM

## 2022-10-08 ENCOUNTER — Other Ambulatory Visit (INDEPENDENT_AMBULATORY_CARE_PROVIDER_SITE_OTHER): Payer: 59

## 2022-10-08 ENCOUNTER — Encounter: Payer: Self-pay | Admitting: Internal Medicine

## 2022-10-08 ENCOUNTER — Ambulatory Visit (INDEPENDENT_AMBULATORY_CARE_PROVIDER_SITE_OTHER): Payer: 59 | Admitting: Internal Medicine

## 2022-10-08 VITALS — BP 118/80 | HR 81 | Ht 67.0 in | Wt 147.0 lb

## 2022-10-08 DIAGNOSIS — R1033 Periumbilical pain: Secondary | ICD-10-CM | POA: Diagnosis not present

## 2022-10-08 DIAGNOSIS — K838 Other specified diseases of biliary tract: Secondary | ICD-10-CM

## 2022-10-08 DIAGNOSIS — K5903 Drug induced constipation: Secondary | ICD-10-CM

## 2022-10-08 DIAGNOSIS — T402X5A Adverse effect of other opioids, initial encounter: Secondary | ICD-10-CM

## 2022-10-08 LAB — COMPREHENSIVE METABOLIC PANEL
ALT: 9 U/L (ref 0–35)
AST: 14 U/L (ref 0–37)
Albumin: 4.7 g/dL (ref 3.5–5.2)
Alkaline Phosphatase: 58 U/L (ref 39–117)
BUN: 10 mg/dL (ref 6–23)
CO2: 32 mEq/L (ref 19–32)
Calcium: 11.4 mg/dL — ABNORMAL HIGH (ref 8.4–10.5)
Chloride: 101 mEq/L (ref 96–112)
Creatinine, Ser: 0.99 mg/dL (ref 0.40–1.20)
GFR: 59.5 mL/min — ABNORMAL LOW (ref >60.00)
Glucose, Bld: 97 mg/dL (ref 70–99)
Potassium: 4 mEq/L (ref 3.5–5.1)
Sodium: 137 mEq/L (ref 135–145)
Total Bilirubin: 0.3 mg/dL (ref 0.2–1.2)
Total Protein: 8.4 g/dL — ABNORMAL HIGH (ref 6.0–8.3)

## 2022-10-08 MED ORDER — LINACLOTIDE 290 MCG PO CAPS: 290.0000 ug | ORAL_CAPSULE | Freq: Every day | ORAL | 3 refills | Status: DC

## 2022-10-08 NOTE — Progress Notes (Signed)
Christie Cervantes 67 y.o. [birth year] [id number]  Assessment & Plan:   Encounter Diagnoses  Name Primary?   Periumbilical pain Yes   Dilated bile duct    Constipation due to opioid therapy     Cause of pain not clear.  It could have been musculoskeletal and improving.  However she does have some dilated extra and intrahepatic bile ducts so we will order an MRCP.  CMET also.  Regarding constipation she wants to try an increased dose of Linzess so we will try 290 mcg samples and prescribed if effective.  If not consider Movantik 25 mcg daily.  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  Thought maybe she strained her muscles.  It is awakening her at night with an aching pain.  No nausea or vomiting.  Bowel habits are similar she has constipation and if she does not go in 3 days she will take Dulcolax with success.  145 mcg dose of Linzess has not been working.  We had started on 72 mcg and I think that was successful at 1 point but her primary care provider up the dose without benefit.  CT abdomen and pelvis on 08/29/2022 notable for borderline intra and extrahepatic biliary duct dilation, normal pancreas intact gallbladder.  Large amount of retained stool she says she was very constipated at that day and she also has atherosclerosis of the aorta.  Maximum bile duct diameter 8 mm. She says she had lab test at primary care, and able to see those through care everywhere. Wt Readings from Last 3 Encounters:  10/08/22 147 lb (66.7 kg)  11/29/20 146 lb 6.2 oz (66.4 kg)  06/25/20 153 lb (69.4 kg)    Allergies  Allergen Reactions   Compazine [Prochlorperazine Maleate] Other (See Comments)    Abnormal Behavior   Diphenhydramine    Current Meds   Medication Sig   amitriptyline (ELAVIL) 50 MG tablet Take 50 mg by mouth at bedtime.   bisacodyl (DULCOLAX) 5 MG EC tablet Take 5 mg by mouth daily as needed for moderate constipation.   clonazePAM (KLONOPIN) 1 MG tablet Take 0.5 mg by mouth 2 (two) times daily as needed for anxiety.   hydrochlorothiazide (MICROZIDE) 12.5 MG capsule Take 1 capsule by mouth daily.   HYDROcodone-acetaminophen (NORCO) 7.5-325 MG tablet 1 tablet every 6 (six) hours as needed.   levothyroxine (SYNTHROID) 75 MCG tablet Take 75 mcg by mouth daily before breakfast.   morphine (MS CONTIN) 60 MG 12 hr tablet Take 60 mg by mouth 2 (two) times daily.   morphine (MS CONTIN) 60 MG 12 hr tablet Take 60 mg by mouth 2 (two) times daily.   naloxone (NARCAN) nasal spray 4 mg/0.1 mL PLEASE SEE ATTACHED FOR DETAILED DIRECTIONS        LINZESS 145 MCG CAPS capsule Take 1 capsule every day by oral route as directed for 90 days, for constipation.   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   Anxiety    situational   Chronic pain    on chronic opioids   Constipation due to opioid therapy 06/25/2020   DDD (degenerative disc disease)    back   Depression    hx of   Diabetes mellitus without complication ([facility])  on meds   Heart murmur    earlier in life   Hyperlipidemia    on meds   Hypertension    no meds now   Insomnia    Thyroid disease    on meds   Tinnitus    Past Surgical History:  Procedure Laterality Date   ABDOMINAL HYSTERECTOMY     PARTIAL   APPENDECTOMY     CARPAL TUNNEL RELEASE Right 11/29/2020   Procedure: RIGHT CARPAL TUNNEL RELEASE;  Surgeon: Betha Loa, MD;  Location: [facility];  Service: Orthopedics;  Laterality: Right;  30 MIN   COLONOSCOPY  2013   CG-MAC-2 day moviprep-normal   CYST EXCISION Left 1990   left breast cyst removed   UTERINE FIBROID SURGERY     x2   WISDOM TOOTH EXTRACTION     Social History   Social History Narrative    The patient is widowed and retired   Every day cigarette smoker no alcohol tobacco or drug use otherwise   family history includes Breast cancer in her sister; Colon polyps (age of onset: 76) in her sister; Diabetes in her sister and sister; Hypercalcemia in her brother; Hypertension in her brother; Multiple myeloma (age of onset: 66) in her mother.   Review of Systems As per HPI  Objective:   Physical Exam BP 118/80   Pulse 81   Ht 5\' 7"  (1.702 m)   Wt 147 lb (66.7 kg)   BMI 23.02 kg/m  Back no CVAT Abd thin, soft NT and neg Carnett's - good mm tone in abd wall also

## 2022-10-08 NOTE — Patient Instructions (Addendum)
Your provider has requested that you go to the basement level for lab work before leaving today. Press "B" on the elevator. The lab is located at the first door on the left as you exit the elevator.  Due to recent changes in healthcare laws, you may see the results of your imaging and laboratory studies on MyChart before your provider has had a chance to review them.  We understand that in some cases there may be results that are confusing or concerning to you. Not all laboratory results come back in the same time frame and the provider may be waiting for multiple results in order to interpret others.  Please give [place] 48 hours in order for your provider to thoroughly review all the results before contacting the office for clarification of your results.   We have provided you with samples of Linzess 290 mcg today-take one daily. Let us know if this doesn't help. If it works we are giving you a printed rx to take to the pharmacy.   Linzess works best when taken once a day every day, on an empty stomach, at least 30 minutes before your first meal of the day.  When Linzess is taken daily as directed:  *Constipation relief is typically felt in about a week *IBS-C patients may begin to experience relief from belly pain and overall abdominal symptoms (pain, discomfort, and bloating) in about 1 week,   with symptoms typically improving over 12 weeks.  Diarrhea may occur in the first 2 weeks -keep taking it.  The diarrhea should go away and you should start having normal, complete, full bowel movements. It may be helpful to start treatment when you can be near the comfort of your own bathroom, such as a weekend.   You have been scheduled for an MRCP at [facility] on 10/17/2022. Your appointment time is 7:00PM. Please arrive to admitting (at main entrance of the hospital) 30 minutes prior to your appointment time for registration purposes. Please make certain not to have anything to eat or drink 6  hours prior to your test. In addition, if you have any metal in your body, have a pacemaker or defibrillator, please be sure to let your ordering physician know. This test typically takes 45 minutes to 1 hour to complete. Should you need to reschedule, please call [phone] to do so.   I appreciate the opportunity to care for you. Stan Head, MD, [facility]

## 2022-10-17 ENCOUNTER — Ambulatory Visit (HOSPITAL_COMMUNITY)
Admission: RE | Admit: 2022-10-17 | Discharge: 2022-10-17 | Disposition: A | Discharge status: Home / Self Care | Payer: 59 | Source: Ambulatory Visit | Attending: Internal Medicine | Admitting: Internal Medicine

## 2022-10-17 ENCOUNTER — Other Ambulatory Visit: Payer: Self-pay | Admitting: Internal Medicine

## 2022-10-17 DIAGNOSIS — R1033 Periumbilical pain: Secondary | ICD-10-CM

## 2022-10-17 DIAGNOSIS — K838 Other specified diseases of biliary tract: Secondary | ICD-10-CM

## 2022-10-17 DIAGNOSIS — T402X5A Adverse effect of other opioids, initial encounter: Secondary | ICD-10-CM

## 2022-10-17 MED ORDER — GADOBUTROL 1 MMOL/ML IV SOLN: 6.0000 mL | Freq: Once | INTRAVENOUS | Status: DC | PRN

## 2022-10-20 ENCOUNTER — Telehealth: Payer: Self-pay | Admitting: Internal Medicine

## 2022-10-20 MED ORDER — NALOXEGOL OXALATE 25 MG PO TABS: 25.0000 mg | ORAL_TABLET | Freq: Every day | ORAL | 11 refills | Status: AC

## 2022-10-20 NOTE — Telephone Encounter (Signed)
Called and explained that the MRI/MRCP is possibly abnormal with "prominent" bile ducts and pancreatic ducts but no mass lesions.  She still has an intermittent mild periumbilical pain that does not require treatment.  I explained that I thought a definitive test here would be an EUS and will ask Dr. Meridee Score his thoughts.  LFTs are NL   I have also dced Linzess and Rxed Movantik for opioid-induced constipation

## 2022-10-22 ENCOUNTER — Other Ambulatory Visit: Payer: Self-pay

## 2022-10-22 DIAGNOSIS — K838 Other specified diseases of biliary tract: Secondary | ICD-10-CM

## 2022-10-22 NOTE — Telephone Encounter (Signed)
EUS has been scheduled for 8/22 at 8 am at [facility] with GM

## 2022-10-22 NOTE — Telephone Encounter (Signed)
Left message on machine to call back  

## 2022-10-22 NOTE — Telephone Encounter (Signed)
CEG, Reviewed the imaging. Happy to do a quick side-viewing exam and EUS, though the PD prominence in the head/neck is not overly concerning to me.  Will rule out microcholedocholithiasis.  Patty, Please offer this patient my next available EUS slot. I have opened up August 22 and I will not be going on CME as had been previously planned. If that does not work then offer her next available Thanks. GM

## 2022-10-23 NOTE — Telephone Encounter (Signed)
Left message on machine to call back  

## 2022-10-23 NOTE — Telephone Encounter (Signed)
EUS scheduled, pt instructed and medications reviewed.  Patient instructions mailed to home.  Patient to call with any questions or concerns.  

## 2022-11-18 ENCOUNTER — Encounter (HOSPITAL_COMMUNITY): Payer: Self-pay | Admitting: Gastroenterology

## 2022-11-26 ENCOUNTER — Other Ambulatory Visit: Payer: Self-pay

## 2022-11-26 DIAGNOSIS — K838 Other specified diseases of biliary tract: Secondary | ICD-10-CM

## 2022-11-27 ENCOUNTER — Encounter (HOSPITAL_COMMUNITY)
Admission: RE | Disposition: A | Discharge status: Home / Self Care | Payer: Self-pay | Source: Home / Self Care | Attending: Gastroenterology

## 2022-11-27 ENCOUNTER — Other Ambulatory Visit: Payer: Self-pay

## 2022-11-27 ENCOUNTER — Ambulatory Visit (HOSPITAL_COMMUNITY)
Admission: RE | Admit: 2022-11-27 | Discharge: 2022-11-27 | Disposition: A | Discharge status: Home / Self Care | Payer: 59 | Attending: Gastroenterology | Admitting: Gastroenterology

## 2022-11-27 ENCOUNTER — Ambulatory Visit (HOSPITAL_BASED_OUTPATIENT_CLINIC_OR_DEPARTMENT_OTHER): Payer: 59 | Admitting: Certified Registered"

## 2022-11-27 ENCOUNTER — Encounter (HOSPITAL_COMMUNITY): Payer: Self-pay | Admitting: Gastroenterology

## 2022-11-27 ENCOUNTER — Ambulatory Visit (HOSPITAL_COMMUNITY): Payer: 59 | Admitting: Certified Registered"

## 2022-11-27 DIAGNOSIS — K222 Esophageal obstruction: Secondary | ICD-10-CM | POA: Insufficient documentation

## 2022-11-27 DIAGNOSIS — Z7984 Long term (current) use of oral hypoglycemic drugs: Secondary | ICD-10-CM | POA: Diagnosis not present

## 2022-11-27 DIAGNOSIS — K31819 Angiodysplasia of stomach and duodenum without bleeding: Secondary | ICD-10-CM | POA: Insufficient documentation

## 2022-11-27 DIAGNOSIS — F17210 Nicotine dependence, cigarettes, uncomplicated: Secondary | ICD-10-CM | POA: Diagnosis not present

## 2022-11-27 DIAGNOSIS — K838 Other specified diseases of biliary tract: Secondary | ICD-10-CM

## 2022-11-27 DIAGNOSIS — I899 Noninfective disorder of lymphatic vessels and lymph nodes, unspecified: Secondary | ICD-10-CM

## 2022-11-27 DIAGNOSIS — K2970 Gastritis, unspecified, without bleeding: Secondary | ICD-10-CM

## 2022-11-27 DIAGNOSIS — I10 Essential (primary) hypertension: Secondary | ICD-10-CM | POA: Diagnosis not present

## 2022-11-27 DIAGNOSIS — K8689 Other specified diseases of pancreas: Secondary | ICD-10-CM

## 2022-11-27 DIAGNOSIS — R1033 Periumbilical pain: Secondary | ICD-10-CM | POA: Insufficient documentation

## 2022-11-27 DIAGNOSIS — R1012 Left upper quadrant pain: Secondary | ICD-10-CM | POA: Insufficient documentation

## 2022-11-27 DIAGNOSIS — K2289 Other specified disease of esophagus: Secondary | ICD-10-CM

## 2022-11-27 DIAGNOSIS — K3189 Other diseases of stomach and duodenum: Secondary | ICD-10-CM | POA: Insufficient documentation

## 2022-11-27 DIAGNOSIS — K449 Diaphragmatic hernia without obstruction or gangrene: Secondary | ICD-10-CM | POA: Diagnosis not present

## 2022-11-27 DIAGNOSIS — E119 Type 2 diabetes mellitus without complications: Secondary | ICD-10-CM | POA: Diagnosis not present

## 2022-11-27 HISTORY — PX: BIOPSY: SHX5522

## 2022-11-27 HISTORY — PX: ESOPHAGOGASTRODUODENOSCOPY: SHX5428

## 2022-11-27 HISTORY — PX: EUS: SHX5427

## 2022-11-27 LAB — GLUCOSE, CAPILLARY: Glucose-Capillary: 90 mg/dL (ref 70–99)

## 2022-11-27 SURGERY — UPPER ENDOSCOPIC ULTRASOUND (EUS) RADIAL
Anesthesia: Monitor Anesthesia Care

## 2022-11-27 MED ORDER — LACTATED RINGERS IV SOLN
INTRAVENOUS | Status: AC | PRN
  Administered 2022-11-27: 1000 mL via INTRAVENOUS

## 2022-11-27 MED ORDER — PROPOFOL 10 MG/ML IV BOLUS
INTRAVENOUS | Status: DC | PRN
Start: 2022-11-27 — End: 2022-11-27
  Administered 2022-11-27 (×2): 20 mg via INTRAVENOUS

## 2022-11-27 MED ORDER — PROPOFOL 500 MG/50ML IV EMUL
INTRAVENOUS | Status: AC
  Filled 2022-11-27: qty 50

## 2022-11-27 MED ORDER — SODIUM CHLORIDE 0.9 % IV SOLN: INTRAVENOUS | Status: DC

## 2022-11-27 MED ORDER — OMEPRAZOLE MAGNESIUM 20 MG PO TBEC: 40.0000 mg | DELAYED_RELEASE_TABLET | Freq: Every day | ORAL | 6 refills | Status: AC

## 2022-11-27 MED ORDER — PROPOFOL 500 MG/50ML IV EMUL
INTRAVENOUS | Status: DC | PRN
  Administered 2022-11-27: 150 ug/kg/min via INTRAVENOUS

## 2022-11-27 MED ORDER — PROPOFOL 1000 MG/100ML IV EMUL
INTRAVENOUS | Status: AC
  Filled 2022-11-27: qty 100

## 2022-11-27 MED ORDER — LIDOCAINE 2% (20 MG/ML) 5 ML SYRINGE
INTRAMUSCULAR | Status: DC | PRN
  Administered 2022-11-27: 100 mg via INTRAVENOUS

## 2022-11-27 NOTE — Anesthesia Postprocedure Evaluation (Signed)
Anesthesia Post Note  Patient: Christie Cervantes  Procedure(s) Performed: UPPER ENDOSCOPIC ULTRASOUND (EUS) RADIAL ESOPHAGOGASTRODUODENOSCOPY (EGD) BIOPSY     Patient location during evaluation: Endoscopy Anesthesia Type: MAC Level of consciousness: awake and alert, patient cooperative and oriented Pain management: pain level controlled Vital Signs Assessment: post-procedure vital signs reviewed and stable Respiratory status: nonlabored ventilation, spontaneous breathing and respiratory function stable Cardiovascular status: blood pressure returned to baseline and stable Postop Assessment: able to ambulate Anesthetic complications: no   No notable events documented.  Last Vitals:  Vitals:   11/27/22 0900 11/27/22 0905  BP: (!) 123/91 (!) 123/91  Pulse: 67 67  Resp: 16 11  Temp:    SpO2: 100% 100%    Last Pain:  Vitals:   11/27/22 0900  TempSrc:   PainSc: 9                  Dominique Ressel,E. Kaylib Furness

## 2022-11-27 NOTE — Op Note (Addendum)
[facility] Patient Name: Christie Cervantes Procedure Date: 11/27/2022 MRN: [id number] Attending MD: Corliss Parish , MD, [id number] Date of Birth: 1955-12-27 CSN: [id number] Age: 67 Admit Type: Outpatient Procedure:                Upper EUS Indications:              Dilated pancreatic duct on MRCP, Abdominal pain in                            the left upper quadrant, Periumbilical abdominal                            pain Providers:                Corliss Parish, MD, Norman Clay, RN, Leanne Lovely, Technician Referring MD:             Iva Boop, MD, Jim Like Medicines:                Monitored Anesthesia Care Complications:            No immediate complications. Estimated Blood Loss:     Estimated blood loss was minimal. Procedure:                Pre-Anesthesia Assessment:                           - Prior to the procedure, a History and Physical                            was performed, and patient medications and                            allergies were reviewed. The patient's tolerance of                            previous anesthesia was also reviewed. The risks                            and benefits of the procedure and the sedation                            options and risks were discussed with the patient.                            All questions were answered, and informed consent                            was obtained. Prior Anticoagulants: The patient has                            taken no anticoagulant or antiplatelet agents. ASA  Grade Assessment: II - A patient with mild systemic                            disease. After reviewing the risks and benefits,                            the patient was deemed in satisfactory condition to                            undergo the procedure.                           After obtaining informed consent, the endoscope was                             passed under direct vision. Throughout the                            procedure, the patient's blood pressure, pulse, and                            oxygen saturations were monitored continuously. The                            [id number]-[id number] ([id number]) Olympus endoscope was introduced                            through the mouth, and advanced to the second part                            of duodenum. The [id number]-[id number] ([id number]) Olympus                            duodenoscope was introduced through the mouth, and                            advanced to the area of papilla. The Linear GF-                            [id number] ) was introduced through the                            mouth, and advanced to the duodenum for ultrasound                            examination from the stomach and duodenum. The                            upper EUS was accomplished without difficulty. The                            patient tolerated the procedure. Scope In: Scope Out: Findings:      ENDOSCOPIC FINDING: :  No gross mucosal lesions were noted in the entire esophagus.      A non-obstructing Schatzki ring was found at the gastroesophageal       junction.      The Z-line was irregular and was found 36 cm from the incisors.      A 3 cm hiatal hernia was present.      An angulation deformity was found in the gastric body.      Segmental and striped moderate inflammation characterized by erythema       and granularity was found in the entire examined stomach. Biopsies were       taken with a cold forceps for histology and Helicobacter pylori testing.      A single diminutive angiodysplastic lesion with typical arborization was       found in the duodenal bulb (not ablated as patient does not have       evidence of anemia on any recent labs).      No other gross lesions were noted in the duodenal bulb, in the first       portion of the duodenum and in the second portion of the duodenum.      The major  papilla was flat but otherwise normal in appearance and had       been hidden under a hood.      ENDOSONOGRAPHIC FINDING: :      Endosonographic imaging of the ampulla showed no intramural       (subepithelial) lesion.      The pancreatic duct had a slight prominence within the pancreatic head       (PD = 1.7 mm -> 3.8 mm), genu of the pancreas (PD = 2.5 mm), body of the       pancreas (PD = 1.8 mm) and tail of the pancreas (PD = 1.6 mm).      Endosonographic imaging in the pancreatic head, genu of the pancreas,       pancreatic body and pancreatic tail showed no chronic pancreatitis,       cyst/pseudocyst or mass.      There was no sign of significant endosonographic abnormality in the       common bile duct (1.4 millimeter-> 4.8 mm -> 6.2 mm) and slight       prominence of the common hepatic duct (7.8 mm). An unremarkable       gallbladder was identified. No evidence of any choledocholithiasis or       sludge.      Endosonographic imaging in the visualized portion of the liver showed no       mass.      No malignant-appearing lymph nodes were visualized in the celiac region       (level 20), peripancreatic region and porta hepatis region.      The celiac region was visualized. Impression:               EGD impression:                           - No gross lesions in the entire esophagus.                            Non-obstructing Schatzki ring at GE junction.  Z-line irregular, 36 cm from the incisors.                           - 3 cm hiatal hernia.                           - Angulation deformity in the gastric body.                           - Gastritis. Biopsied.                           - A single angiodysplastic lesion in the duodenum                            bulb.                           - No other gross lesions in the duodenal bulb, in                            the first portion of the duodenum and in the second                             portion of the duodenum.                           - Normal flat major papilla (hidden under a hood).                           EUS impression:                           - No evidence of an intramural lesion of the                            ampulla.                           - The pancreatic duct had prominence throughout the                            pancreatic head, genu of the pancreas, body of the                            pancreas and tail of the pancreas.                           - No evidence of any pancreatic masses or lesions                            were found.                           - There was no sign of significant pathology in the  common bile duct and only slight prominence of the                            common hepatic duct. The gallbladder did not show                            evidence of stones. There was no evidence of                            choledocholithiasis.                           - No malignant-appearing lymph nodes were                            visualized in the celiac region (level 20),                            peripancreatic region and porta hepatis region. Moderate Sedation:      Not Applicable - Patient had care per Anesthesia. Recommendation:           - The patient will be observed post-procedure,                            until all discharge criteria are met.                           - Discharge patient to home.                           - Patient has a contact number available for                            emergencies. The signs and symptoms of potential                            delayed complications were discussed with the                            patient. Return to normal activities tomorrow.                            Written discharge instructions were provided to the                            patient.                           - Resume previous diet.                           - Observe  patient's clinical course.                           - Initiate Omeprazole 40 mg daily.                           -  Continue present medications otherwise.                           - Followup pathology.                           - Unless the patient were to have evidence of                            significant obstructive/cholestatic LFT pattern, I                            see no role for endoscopic evaluation via ERCP.                           - Etiology of patient's symptoms is not clearly                            defined by today's endoscopic ultrasound unless                            gastritis is playing some role - will see how she                            does.                           - The findings and recommendations were discussed                            with the patient.                           - The findings and recommendations were discussed                            with the patient's family. Procedure Code(s):        --- Professional ---                           [phone], Esophagogastroduodenoscopy, flexible,                            transoral; with endoscopic ultrasound examination                            limited to the esophagus, stomach or duodenum, and                            adjacent structures                           43239, Esophagogastroduodenoscopy, flexible,                            transoral; with biopsy, single or multiple Diagnosis Code(s):        ---  Professional ---                           K22.2, Esophageal obstruction                           K22.89, Other specified disease of esophagus                           K44.9, Diaphragmatic hernia without obstruction or                            gangrene                           K31.89, Other diseases of stomach and duodenum                           K29.70, Gastritis, unspecified, without bleeding                           K31.819, Angiodysplasia of stomach and duodenum                             without bleeding                           I89.9, Noninfective disorder of lymphatic vessels                            and lymph nodes, unspecified                           K86.89, Other specified diseases of pancreas                           R10.12, Left upper quadrant pain                           R10.33, Periumbilical pain CPT copyright 2022 American Medical Association. All rights reserved. The codes documented in this report are preliminary and upon coder review may  be revised to meet current compliance requirements. Corliss Parish, MD 11/27/2022 8:58:13 AM Number of Addenda: 0

## 2022-11-27 NOTE — Anesthesia Preprocedure Evaluation (Addendum)
Anesthesia Evaluation  Patient identified by MRN, date of birth, ID band Patient awake    Reviewed: Allergy & Precautions, NPO status , Patient's Chart, lab work & pertinent test results  History of Anesthesia Complications Negative for: history of anesthetic complications  Airway Mallampati: I  TM Distance: >3 FB Neck ROM: Full    Dental  (+) Edentulous Upper, Edentulous Lower   Pulmonary Current SmokerPatient did not abstain from smoking.   breath sounds clear to auscultation       Cardiovascular hypertension, Pt. on medications (-) angina  Rhythm:Regular Rate:Normal  '20 exercise stress:   There was no ST segment deviation noted during stress.  No T wave inversion was noted during stress.  Blood pressure demonstrated a normal response to exercise.    Neuro/Psych  Headaches  Anxiety Depression       GI/Hepatic negative GI ROS, Neg liver ROS,,,  Endo/Other  diabetes (glu 90), Oral Hypoglycemic AgentsHypothyroidism    Renal/GU negative Renal ROS     Musculoskeletal   Abdominal   Peds  Hematology   Anesthesia Other Findings   Reproductive/Obstetrics                             Anesthesia Physical Anesthesia Plan  ASA: 2  Anesthesia Plan: MAC   Post-op Pain Management: Minimal or no pain anticipated   Induction:   PONV Risk Score and Plan: 1 and Ondansetron and Treatment may vary due to age or medical condition  Airway Management Planned: Natural Airway and Nasal Cannula  Additional Equipment: None  Intra-op Plan:   Post-operative Plan:   Informed Consent: I have reviewed the patients History and Physical, chart, labs and discussed the procedure including the risks, benefits and alternatives for the proposed anesthesia with the patient or authorized representative who has indicated his/her understanding and acceptance.     Dental advisory given  Plan Discussed with:  CRNA and Surgeon  Anesthesia Plan Comments:         Anesthesia Quick Evaluation

## 2022-11-27 NOTE — Transfer of Care (Signed)
Immediate Anesthesia Transfer of Care Note  Patient: Christie Cervantes  Procedure(s) Performed: UPPER ENDOSCOPIC ULTRASOUND (EUS) RADIAL ESOPHAGOGASTRODUODENOSCOPY (EGD) BIOPSY  Patient Location: PACU and Endoscopy Unit  Anesthesia Type:MAC  Level of Consciousness: drowsy and responds to stimulation  Airway & Oxygen Therapy: Patient Spontanous Breathing and Patient connected to face mask oxygen  Post-op Assessment: Report given to RN and Post -op Vital signs reviewed and stable  Post vital signs: Reviewed and stable  Last Vitals:  Vitals Value Taken Time  BP 108/64 11/27/22 0845  Temp    Pulse 57 11/27/22 0846  Resp 14 11/27/22 0846  SpO2 100 % 11/27/22 0846  Vitals shown include unfiled device data.  Last Pain:  Vitals:   11/27/22 0721  TempSrc: Tympanic  PainSc: 0-No pain         Complications: No notable events documented.

## 2022-11-27 NOTE — H&P (Signed)
GASTROENTEROLOGY PROCEDURE H&P NOTE   Primary Care Physician: Jim Like, NP  HPI: Christie Cervantes is a 67 y.o. female who presents for EGD/EUS to evaluate the pancreas.  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   Anxiety    situational   Chronic pain    on chronic opioids   Constipation due to opioid therapy 06/25/2020   DDD (degenerative disc disease)    back   Depression    hx of   Diabetes mellitus without complication (HCC)    on meds   Heart murmur    earlier in life   Hyperlipidemia    on meds   Hypertension    no meds now   Insomnia    Thyroid disease    on meds   Tinnitus    Past Surgical History:  Procedure Laterality Date   ABDOMINAL HYSTERECTOMY     PARTIAL   APPENDECTOMY     CARPAL TUNNEL RELEASE Right 11/29/2020   Procedure: RIGHT CARPAL TUNNEL RELEASE;  Surgeon: Betha Loa, MD;  Location: [facility];  Service: Orthopedics;  Laterality: Right;  30 MIN   COLONOSCOPY  2013   CG-MAC-2 day moviprep-normal   CYST EXCISION Left 1990   left breast cyst removed   UTERINE FIBROID SURGERY     x2   WISDOM TOOTH EXTRACTION     Current Facility-Administered Medications  Medication Dose Route Frequency Provider Last Rate Last Admin   0.9 %  sodium chloride infusion   Intravenous Continuous Mansouraty, Netty Starring., MD       lactated ringers infusion    Continuous PRN Mansouraty, Netty Starring., MD 10 mL/hr at 11/27/22 0731 1,000 mL at 11/27/22 0731    Current Facility-Administered Medications:    0.9 %  sodium chloride infusion, , Intravenous, Continuous, Mansouraty, Netty Starring., MD   lactated ringers infusion, , , Continuous PRN, Mansouraty, Netty Starring., MD, Last Rate: 10 mL/hr at 11/27/22 0731, 1,000 mL at 11/27/22 5409 Allergies  Allergen Reactions   Compazine [Prochlorperazine Maleate] Other (See Comments)    Abnormal Behavior   Diphenhydramine    Family History  Problem Relation Age of Onset    Multiple myeloma Mother 56   Breast cancer Sister    Diabetes Sister    Colon polyps Sister 96   Diabetes Sister    Hypercalcemia Brother    Hypertension Brother    Colon cancer Neg Hx    Esophageal cancer Neg Hx    Stomach cancer Neg Hx    Rectal cancer Neg Hx    Social History   Socioeconomic History   Marital status: Widowed    Spouse name: Not on file   Number of children: Not on file   Years of education: Not on file   Highest education level: Not on file  Occupational History   Not on file  Tobacco Use   Smoking status: Every Day    Current packs/day: 0.25    Average packs/day: 0.3 packs/day for 20.0 years (5.0 ttl pk-yrs)    Types: Cigarettes   Smokeless tobacco: Never   Tobacco comments:    Smokes 2-3 cigs daily and vapes daily  Vaping Use   Vaping status: Every Day   Substances: Nicotine  Substance and Sexual Activity   Alcohol use: No   Drug use: No   Sexual activity: Not Currently    Birth control/protection: Surgical  Other Topics Concern   Not on file  Social  History Narrative   The patient is widowed and retired   Every day cigarette smoker no alcohol tobacco or drug use otherwise   Social Determinants of Corporate investment banker Strain: Not on file  Food Insecurity: Not on file  Transportation Needs: Not on file  Physical Activity: Not on file  Stress: Not on file  Social Connections: Unknown (08/20/2021)   Received from [facility]   Social Network    Social Network: Not on file  Intimate Partner Violence: Unknown (07/11/2021)   Received from [facility]   HITS    Physically Hurt: Not on file    Insult or Talk Down To: Not on file    Threaten Physical Harm: Not on file    Scream or Curse: Not on file    Physical Exam: Today's Vitals   11/27/22 0721  BP: (!) 147/87  Pulse: 82  Resp: 13  Temp: 97.8 F (36.6 C)  TempSrc: Tympanic  SpO2: 98%  Weight: 68.5 kg  Height: 5\' 7"  (1.702 m)  PainSc: 0-No pain   Body mass index is  23.65 kg/m. GEN: NAD EYE: Sclerae anicteric ENT: MMM CV: Non-tachycardic GI: Soft, NT/ND NEURO:  Alert & Oriented x 3  Lab Results: No results for input(s): "WBC", "HGB", "HCT", "PLT" in the last 72 hours. BMET No results for input(s): "NA", "K", "CL", "CO2", "GLUCOSE", "BUN", "CREATININE", "CALCIUM" in the last 72 hours. LFT No results for input(s): "PROT", "ALBUMIN", "AST", "ALT", "ALKPHOS", "BILITOT", "BILIDIR", "IBILI" in the last 72 hours. PT/INR No results for input(s): "LABPROT", "INR" in the last 72 hours.   Impression / Plan: This is a 67 y.o.female who presents for EGD/EUS to evaluate the pancreas.  The risks of an EUS including intestinal perforation, bleeding, infection, aspiration, and medication effects were discussed as was the possibility it may not give a definitive diagnosis if a biopsy is performed.  When a biopsy of the pancreas is done as part of the EUS, there is an additional risk of pancreatitis at the rate of about 1-2%.  It was explained that procedure related pancreatitis is typically mild, although it can be severe and even life threatening, which is why we do not perform random pancreatic biopsies and only biopsy a lesion/area we feel is concerning enough to warrant the risk.   The risks and benefits of endoscopic evaluation/treatment were discussed with the patient and/or family; these include but are not limited to the risk of perforation, infection, bleeding, missed lesions, lack of diagnosis, severe illness requiring hospitalization, as well as anesthesia and sedation related illnesses.  The patient's history has been reviewed, patient examined, no change in status, and deemed stable for procedure.  The patient and/or family is agreeable to proceed.    Christie Parish, MD  Gastroenterology [facility] Office # [phone]

## 2022-11-27 NOTE — Discharge Instructions (Signed)
 YOU HAD AN ENDOSCOPIC PROCEDURE TODAY: Refer to the procedure report and other information in the discharge instructions given to you for any specific questions about what was found during the examination. If this information does not answer your questions, please call [place] office at [phone] to clarify.   YOU SHOULD EXPECT: Some feelings of bloating in the abdomen. Passage of more gas than usual. Walking can help get rid of the air that was put into your GI tract during the procedure and reduce the bloating. If you had a lower endoscopy (such as a colonoscopy or flexible sigmoidoscopy) you may notice spotting of blood in your stool or on the toilet paper. Some abdominal soreness may be present for a day or two, also.  DIET: Your first meal following the procedure should be a light meal and then it is ok to progress to your normal diet. A half-sandwich or bowl of soup is an example of a good first meal. Heavy or fried foods are harder to digest and may make you feel nauseous or bloated. Drink plenty of fluids but you should avoid alcoholic beverages for 24 hours. If you had a esophageal dilation, please see attached instructions for diet.    ACTIVITY: Your care partner should take you home directly after the procedure. You should plan to take it easy, moving slowly for the rest of the day. You can resume normal activity the day after the procedure however YOU SHOULD NOT DRIVE, use power tools, machinery or perform tasks that involve climbing or major physical exertion for 24 hours (because of the sedation medicines used during the test).   SYMPTOMS TO REPORT IMMEDIATELY: A gastroenterologist can be reached at any hour. Please call [phone]  for any of the following symptoms:  Following upper endoscopy (EGD, EUS) Vomiting of blood or coffee ground material  New, significant abdominal pain  New, significant chest pain or pain under the shoulder blades  Painful or persistently difficult  swallowing  New shortness of breath  Black, tarry-looking or red, bloody stools  FOLLOW UP:  If any biopsies were taken you will be contacted by phone or by letter within the next 1-3 weeks. Call [phone]  if you have not heard about the biopsies in 3 weeks.  Please also call with any specific questions about appointments or follow up tests.

## 2022-11-27 NOTE — Anesthesia Procedure Notes (Signed)
Procedure Name: MAC Date/Time: 11/27/2022 8:08 AM  Performed by: Sindy Guadeloupe, CRNAPre-anesthesia Checklist: Patient identified, Emergency Drugs available, Suction available, Patient being monitored and Timeout performed Oxygen Delivery Method: Simple face mask Placement Confirmation: positive ETCO2

## 2022-11-28 LAB — SURGICAL PATHOLOGY

## 2022-11-29 ENCOUNTER — Encounter: Payer: Self-pay | Admitting: Gastroenterology

## 2022-11-30 ENCOUNTER — Encounter (HOSPITAL_COMMUNITY): Payer: Self-pay | Admitting: Gastroenterology

## 2022-11-30 NOTE — Progress Notes (Signed)
Nurses and doctors within [facility] exceptional and very thankful for the care.

## 2023-02-03 ENCOUNTER — Other Ambulatory Visit (HOSPITAL_COMMUNITY): Payer: Self-pay | Admitting: Anesthesiology

## 2023-02-03 DIAGNOSIS — M545 Low back pain, unspecified: Secondary | ICD-10-CM

## 2023-03-04 ENCOUNTER — Ambulatory Visit (HOSPITAL_COMMUNITY)
Admission: RE | Admit: 2023-03-04 | Discharge: 2023-03-04 | Disposition: A | Discharge status: Home / Self Care | Payer: 59 | Source: Ambulatory Visit | Attending: Anesthesiology | Admitting: Anesthesiology

## 2023-03-04 DIAGNOSIS — M545 Low back pain, unspecified: Secondary | ICD-10-CM | POA: Diagnosis present

## 2023-06-18 ENCOUNTER — Telehealth: Payer: Self-pay | Admitting: Internal Medicine

## 2023-06-18 NOTE — Telephone Encounter (Signed)
 She does not need a colonoscopy for this  I see she is seeing ortho for low back pain - could be referred from that

## 2023-06-18 NOTE — Telephone Encounter (Signed)
 Inbound call from patient stating she is still having lower left abdominal pain. Patient stated she did not want to wait until May to be seen. Requesting a call back. Please advise, thank you.

## 2023-06-18 NOTE — Telephone Encounter (Signed)
 Same LLQ pain she was seen for last Fall. This pain has never improved. It only happens when she has been lying down for an extended period of time. She will stand and that relieves the pain. She has "googled" it and reads to me the results of her search. Patient asks if she needs a colonoscopy.

## 2023-06-18 NOTE — Telephone Encounter (Signed)
 Patient called and stated that she had left out important information for the doctor to know and would for Beth to return her call . Please advise.

## 2023-06-19 NOTE — Telephone Encounter (Signed)
 Patient advised. She does see an ortho provider due to her back. She is seeing her PCP soon. Agrees to address this ongoing issue at the visit.

## 2023-10-23 ENCOUNTER — Encounter: Payer: Self-pay | Admitting: Advanced Practice Midwife

## 2024-02-17 ENCOUNTER — Other Ambulatory Visit: Payer: Self-pay | Admitting: Physician Assistant

## 2024-02-17 DIAGNOSIS — R7401 Elevation of levels of liver transaminase levels: Secondary | ICD-10-CM

## 2024-02-29 ENCOUNTER — Ambulatory Visit
Admission: RE | Admit: 2024-02-29 | Discharge: 2024-02-29 | Disposition: A | Discharge status: Home / Self Care | Source: Ambulatory Visit | Attending: Physician Assistant | Admitting: Physician Assistant

## 2024-02-29 DIAGNOSIS — R7401 Elevation of levels of liver transaminase levels: Secondary | ICD-10-CM
